# Patient Record
Sex: Male | Born: 1980 | Race: Black or African American | Hispanic: No | Marital: Single | Smoking: Current every day smoker
Health system: Southern US, Community
[De-identification: ages and names within clinical notes are randomized; demographics above are authoritative.]

## PROBLEM LIST (undated history)

## (undated) DIAGNOSIS — M1990 Unspecified osteoarthritis, unspecified site: Secondary | ICD-10-CM

---

## 2005-01-13 ENCOUNTER — Emergency Department (HOSPITAL_COMMUNITY): Admission: EM | Admit: 2005-01-13 | Discharge: 2005-01-13 | Payer: Self-pay | Admitting: Emergency Medicine

## 2010-01-01 ENCOUNTER — Emergency Department (HOSPITAL_COMMUNITY): Admission: EM | Admit: 2010-01-01 | Discharge: 2010-01-01 | Payer: Self-pay | Admitting: Emergency Medicine

## 2011-05-10 ENCOUNTER — Emergency Department (HOSPITAL_COMMUNITY)
Admission: EM | Admit: 2011-05-10 | Discharge: 2011-05-11 | Disposition: A | Discharge status: Home / Self Care | Payer: Self-pay | Attending: Emergency Medicine | Admitting: Emergency Medicine

## 2011-05-10 DIAGNOSIS — M25569 Pain in unspecified knee: Secondary | ICD-10-CM | POA: Insufficient documentation

## 2011-05-10 DIAGNOSIS — X500XXA Overexertion from strenuous movement or load, initial encounter: Secondary | ICD-10-CM | POA: Insufficient documentation

## 2011-05-10 DIAGNOSIS — IMO0002 Reserved for concepts with insufficient information to code with codable children: Secondary | ICD-10-CM | POA: Insufficient documentation

## 2011-05-11 ENCOUNTER — Emergency Department (HOSPITAL_COMMUNITY): Payer: Self-pay

## 2012-12-22 ENCOUNTER — Emergency Department (HOSPITAL_COMMUNITY): Payer: Self-pay

## 2012-12-22 ENCOUNTER — Inpatient Hospital Stay (HOSPITAL_COMMUNITY): Payer: Self-pay

## 2012-12-22 ENCOUNTER — Inpatient Hospital Stay (HOSPITAL_COMMUNITY)
Admission: EM | Admit: 2012-12-22 | Discharge: 2012-12-22 | DRG: 563 | Disposition: A | Discharge status: Home / Self Care | Payer: Self-pay | Attending: Emergency Medicine | Admitting: Emergency Medicine

## 2012-12-22 DIAGNOSIS — T07XXXA Unspecified multiple injuries, initial encounter: Secondary | ICD-10-CM

## 2012-12-22 DIAGNOSIS — S5292XA Unspecified fracture of left forearm, initial encounter for closed fracture: Secondary | ICD-10-CM

## 2012-12-22 DIAGNOSIS — IMO0002 Reserved for concepts with insufficient information to code with codable children: Secondary | ICD-10-CM | POA: Diagnosis present

## 2012-12-22 DIAGNOSIS — S52599A Other fractures of lower end of unspecified radius, initial encounter for closed fracture: Principal | ICD-10-CM | POA: Diagnosis present

## 2012-12-22 LAB — POCT I-STAT, CHEM 8
Creatinine, Ser: 1.3 mg/dL (ref 0.50–1.35)
HCT: 53 % — ABNORMAL HIGH (ref 39.0–52.0)
Hemoglobin: 18 g/dL — ABNORMAL HIGH (ref 13.0–17.0)
Potassium: 5.1 mEq/L (ref 3.5–5.1)
Sodium: 141 mEq/L (ref 135–145)
TCO2: 27 mmol/L (ref 0–100)

## 2012-12-22 MED ORDER — MORPHINE SULFATE 4 MG/ML IJ SOLN
4.0000 mg | Freq: Once | INTRAMUSCULAR | Status: AC
  Administered 2012-12-22: 4 mg via INTRAVENOUS
  Filled 2012-12-22: qty 1

## 2012-12-22 MED ORDER — TETANUS-DIPHTH-ACELL PERTUSSIS 5-2.5-18.5 LF-MCG/0.5 IM SUSP
0.5000 mL | Freq: Once | INTRAMUSCULAR | Status: AC
  Administered 2012-12-22: 0.5 mL via INTRAMUSCULAR
  Filled 2012-12-22: qty 0.5

## 2012-12-22 MED ORDER — ONDANSETRON HCL 4 MG/2ML IJ SOLN
4.0000 mg | Freq: Once | INTRAMUSCULAR | Status: AC
  Administered 2012-12-22: 4 mg via INTRAVENOUS
  Filled 2012-12-22: qty 2

## 2012-12-22 MED ORDER — HYDROCODONE-ACETAMINOPHEN 5-325 MG PO TABS: 1.0000 | ORAL_TABLET | ORAL | Status: DC | PRN

## 2012-12-22 MED ORDER — ONDANSETRON HCL 4 MG/2ML IJ SOLN
INTRAMUSCULAR | Status: AC
  Administered 2012-12-22: 4 mg via INTRAVENOUS
  Filled 2012-12-22: qty 2
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
## 2012-12-22 NOTE — ED Notes (Signed)
Patient laying on stretcher at this time. States he is still having some pain, rates pain 7/10. Informed patient that they will be coming to do another x ray of chest. Patient is in no acute distress, resp are even and unlabored. c-collar in place. Call light on bed. Will continue to monitor.

## 2012-12-22 NOTE — ED Notes (Signed)
Patient to radiology for CT and xrays.
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
## 2012-12-22 NOTE — ED Notes (Signed)
Ortho paged for splint 

## 2012-12-22 NOTE — ED Notes (Signed)
Patient returned from radiology

## 2012-12-22 NOTE — Progress Notes (Signed)
Orthopedic Tech Progress Note Patient Details:  Richard Turner 12-Jun-1981 [id number]  Ortho Devices Type of Ortho Device: Ace wrap;Arm sling;Sugartong splint Ortho Device/Splint Location: (L) UE Ortho Device/Splint Interventions: Application   [name] 12/22/2012, 11:10 PM

## 2012-12-22 NOTE — ED Notes (Signed)
Patients left hand cleaned and dressed. Ortho in room at this time to place splint on left arm.
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
## 2012-12-23 ENCOUNTER — Encounter (HOSPITAL_COMMUNITY): Payer: Self-pay | Admitting: Emergency Medicine
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
## 2013-01-02 ENCOUNTER — Emergency Department (HOSPITAL_COMMUNITY): Admission: EM | Admit: 2013-01-02 | Discharge: 2013-01-02 | Discharge status: Against Medical Advice | Payer: MEDICAID

## 2013-03-08 ENCOUNTER — Telehealth: Payer: Self-pay | Admitting: Family Medicine
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
## 2013-04-17 NOTE — Telephone Encounter (Signed)
Unable to leave a message.

## 2013-09-12 ENCOUNTER — Encounter (HOSPITAL_COMMUNITY): Payer: Self-pay | Admitting: Emergency Medicine

## 2013-09-12 ENCOUNTER — Emergency Department (HOSPITAL_COMMUNITY)
Admission: EM | Admit: 2013-09-12 | Discharge: 2013-09-12 | Disposition: A | Discharge status: Home / Self Care | Payer: Medicaid Other | Attending: Emergency Medicine | Admitting: Emergency Medicine

## 2013-09-12 DIAGNOSIS — F17200 Nicotine dependence, unspecified, uncomplicated: Secondary | ICD-10-CM | POA: Insufficient documentation

## 2013-09-12 DIAGNOSIS — K0889 Other specified disorders of teeth and supporting structures: Secondary | ICD-10-CM

## 2013-09-12 DIAGNOSIS — K089 Disorder of teeth and supporting structures, unspecified: Secondary | ICD-10-CM | POA: Insufficient documentation

## 2013-09-12 DIAGNOSIS — J029 Acute pharyngitis, unspecified: Secondary | ICD-10-CM | POA: Insufficient documentation

## 2013-09-12 MED ORDER — HYDROCODONE-ACETAMINOPHEN 5-325 MG PO TABS: 1.0000 | ORAL_TABLET | Freq: Four times a day (QID) | ORAL | Status: DC | PRN

## 2013-09-12 MED ORDER — PENICILLIN V POTASSIUM 500 MG PO TABS: 500.0000 mg | ORAL_TABLET | Freq: Four times a day (QID) | ORAL | Status: AC

## 2013-09-12 NOTE — ED Notes (Signed)
Pt. reports left lower molar pain / cavity  onset 2 days ago .

## 2013-09-12 NOTE — ED Provider Notes (Signed)
CSN: [id number]     Arrival date & time 09/12/13  3086 History  This chart was scribed for Raymon Mutton, PA-C, working with Flint Melter, MD, by Ardelia Mems ED Scribe. This patient was seen in room TR04C/TR04C and the patient's care was started at 11:38 PM.    Chief Complaint  Patient presents with  . Dental Pain    The history is provided by the patient. No language interpreter was used.    HPI Comments: Richard Turner is a 32 y.o. male who presents to the Emergency Department complaining of constant, moderate "throbbing" left lower dental pain onset 3 days ago. He states that he had a cavity in a left lower molar to the area that has recently cracked. He states that this pain has been waking him from sleep at times. He states that it has "been a while" since seen by dentist. He also reports associated sore throat, difficulty swallowing, facial swelling, intermittent fever and chills over the past few days. He further states that he has noticed some bleeding from the area, and he has noticed a bad taste in his mouth over the past few days. He states that he has taken Tylenol with minimal relief of pain. He states that he has no medication allergies.   History reviewed. No pertinent past medical history. History reviewed. No pertinent past surgical history. No family history on file. History  Substance Use Topics  . Smoking status: Current Every Day Smoker  . Smokeless tobacco: Not on file  . Alcohol Use: No    Review of Systems  Constitutional: Positive for fever and chills.  HENT: Positive for dental problem, facial swelling, sore throat and trouble swallowing.   All other systems reviewed and are negative.   Allergies  Review of patient's allergies indicates no known allergies.  Home Medications   Current Outpatient Rx  Name  Route  Sig  Dispense  Refill  . HYDROcodone-acetaminophen (NORCO/VICODIN) 5-325 MG per tablet   Oral   Take 1 tablet by mouth every 6 (six)  hours as needed.   11 tablet   0   . penicillin v potassium (VEETID) 500 MG tablet   Oral   Take 1 tablet (500 mg total) by mouth 4 (four) times daily.   40 tablet   0      Triage Vitals: BP 131/79  Pulse 86  Temp(Src) 98.1 F (36.7 C) (Oral)  Resp 16  SpO2 98%  Physical Exam  Nursing note and vitals reviewed. Constitutional: He is oriented to person, place, and time. He appears well-developed and well-nourished. No distress.  HENT:  Head: Normocephalic and atraumatic.  Mouth/Throat: Oropharynx is clear and moist. No oropharyngeal exudate.    Mild facial swelling localized to the left lower jawline. Mild discomfort upon palpation to left mandibular region. Poor dentition identified-second premolar of left mandibular jawline identified to be diagrammed in decaying process. Discomfort upon palpation. Negative abscess or active drainage identified. Negative bleeding identified. Negative trismus. Uvula midline, elevation. Negative uvula swelling. Negative sublingual lesions.  Eyes: Conjunctivae and EOM are normal. Pupils are equal, round, and reactive to light. Right eye exhibits no discharge. Left eye exhibits no discharge.  Neck: Normal range of motion. Neck supple.  Negative neck stiffness Negative nuchal rigidity Negative cervical lymphadenopathy  Cardiovascular: Normal rate, regular rhythm and normal heart sounds.   Pulses:      Radial pulses are 2+ on the right side, and 2+ on the left side.  Pulmonary/Chest: Effort normal  and breath sounds normal. No respiratory distress. He has no wheezes. He has no rales.  Musculoskeletal: Normal range of motion.  Lymphadenopathy:    He has no cervical adenopathy.  Neurological: He is alert and oriented to person, place, and time. No cranial nerve deficit. He exhibits normal muscle tone. Coordination normal.  Cranial nerves III-XII grossly intact  Skin: Skin is warm and dry. No rash noted. He is not diaphoretic. No erythema.   Psychiatric: He has a normal mood and affect. His behavior is normal. Thought content normal.    ED Course  Procedures (including critical care time)  DIAGNOSTIC STUDIES: Oxygen Saturation is 98% on RA, normal by my interpretation.    COORDINATION OF CARE: 11:44 PM- Discussed plan to discharge pt with Veetid and Norco. Will refer to Dentistry. Pt advised of plan for treatment and pt agrees.  Labs Review Labs Reviewed - No data to display Imaging Review No results found.  EKG Interpretation   None       MDM   1. Pain, dental    Filed Vitals:   09/12/13 1940  BP: 131/79  Pulse: 86  Temp: 98.1 F (36.7 C)  TempSrc: Oral  Resp: 16  SpO2: 98%   I personally performed the services described in this documentation, which was scribed in my presence. The recorded information has been reviewed and is accurate.  Patient presenting to emergency apartment that the pain has been ongoing for the past couple of days, worsening identified. Localized to the left side of the face with radiation to the left side of the face. Describes intermittent numbness and tingling. Alert and oriented. GCS 15. Heart rate and rhythm normal. Lungs good auscultation bilaterally. Mild swelling localized left mandibular region. Discomfort upon palpation to left mandibular region. Diagrammed second premolar of the left mandible jawline identified with pain upon palpation to his tooth currently in decaying process. Negative trismus. Negative uvula swelling. Uvula midline, symmetrical elevation. Negative for blood in the lesions. Doubt peritonsillar abscess. Doubt Ludwig's angina. Dental pain secondary to poor dental hygiene, as well as port dental followup. Patient stable, afebrile. Possible beginnings of periapical abscess identified. Patient discharged. Discharge patient with small dose of pain medications-discussed course, precautions, disposal technique. Discharge patient with antibiotics. Referred patient to  dentist and oral surgeon. Dental referral sheet given in emergency Department discharge paperwork. Discussed with patient to closely monitor symptoms and if symptoms are to worsen or change to report back to the ED - strict return instructions given.  Patient agreed to plan of care, understood, all questions answered.    Raymon Mutton, PA-C 09/15/13 1512

## 2013-09-12 NOTE — ED Notes (Signed)
The pt keeps coming from the room c/o pain and asking for the same

## 2013-09-12 NOTE — ED Notes (Signed)
Pt asking for pain med 

## 2013-09-16 NOTE — ED Provider Notes (Signed)
Medical screening examination/treatment/procedure(s) were performed by non-physician practitioner and as supervising physician I was immediately available for consultation/collaboration.  Flint MelterElliott L , MD 09/16/13 [phone]

## 2013-10-01 ENCOUNTER — Encounter (HOSPITAL_COMMUNITY): Payer: Self-pay | Admitting: Emergency Medicine

## 2013-10-01 ENCOUNTER — Emergency Department (HOSPITAL_COMMUNITY)
Admission: EM | Admit: 2013-10-01 | Discharge: 2013-10-01 | Disposition: A | Discharge status: Home / Self Care | Payer: Medicaid Other | Attending: Emergency Medicine | Admitting: Emergency Medicine

## 2013-10-01 DIAGNOSIS — K089 Disorder of teeth and supporting structures, unspecified: Secondary | ICD-10-CM | POA: Insufficient documentation

## 2013-10-01 DIAGNOSIS — Z792 Long term (current) use of antibiotics: Secondary | ICD-10-CM | POA: Insufficient documentation

## 2013-10-01 DIAGNOSIS — R599 Enlarged lymph nodes, unspecified: Secondary | ICD-10-CM | POA: Insufficient documentation

## 2013-10-01 DIAGNOSIS — K0889 Other specified disorders of teeth and supporting structures: Secondary | ICD-10-CM

## 2013-10-01 DIAGNOSIS — F17200 Nicotine dependence, unspecified, uncomplicated: Secondary | ICD-10-CM | POA: Insufficient documentation

## 2013-10-01 MED ORDER — PENICILLIN V POTASSIUM 250 MG PO TABS: 500.0000 mg | ORAL_TABLET | Freq: Four times a day (QID) | ORAL | Status: AC

## 2013-10-01 MED ORDER — HYDROCODONE-ACETAMINOPHEN 5-325 MG PO TABS
2.0000 | ORAL_TABLET | Freq: Once | ORAL | Status: AC
  Administered 2013-10-01: 2 via ORAL
  Filled 2013-10-01: qty 2

## 2013-10-01 MED ORDER — PENICILLIN V POTASSIUM 250 MG PO TABS
500.0000 mg | ORAL_TABLET | Freq: Once | ORAL | Status: AC
  Administered 2013-10-01: 500 mg via ORAL
  Filled 2013-10-01: qty 2

## 2013-10-01 MED ORDER — HYDROCODONE-ACETAMINOPHEN 5-325 MG PO TABS: 1.0000 | ORAL_TABLET | ORAL | Status: DC | PRN

## 2013-10-01 NOTE — Discharge Instructions (Signed)
The antibiotics prescribed you for the full course. This antibiotic [facility]. Recommend 600 mg ibuprofen every 6 hours for pain control. You may take Norco as prescribed for breakthrough pain. Follow up with a dentist on Monday. Return if symptoms worsen.  Dental Pain A tooth ache may be caused by cavities (tooth decay). Cavities expose the nerve of the tooth to air and hot or cold temperatures. It may come from an infection or abscess (also called a boil or furuncle) around your tooth. It is also often caused by dental caries (tooth decay). This causes the pain you are having. DIAGNOSIS  Your caregiver can diagnose this problem by exam. TREATMENT   If caused by an infection, it may be treated with medications which kill germs (antibiotics) and pain medications as prescribed by your caregiver. Take medications as directed.  Only take over-the-counter or prescription medicines for pain, discomfort, or fever as directed by your caregiver.  Whether the tooth ache today is caused by infection or dental disease, you should see your dentist as soon as possible for further care. SEEK MEDICAL CARE IF: The exam and treatment you received today has been provided on an emergency basis only. This is not a substitute for complete medical or dental care. If your problem worsens or new problems (symptoms) appear, and you are unable to meet with your dentist, call or return to this location. SEEK IMMEDIATE MEDICAL CARE IF:   You have a fever.  You develop redness and swelling of your face, jaw, or neck.  You are unable to open your mouth.  You have severe pain uncontrolled by pain medicine. MAKE SURE YOU:   Understand these instructions.  Will watch your condition.  Will get help right away if you are not doing well or get worse. Document Released: 08/31/2005 Document Revised: 11/23/2011 Document Reviewed: 04/18/2008 [facility] Patient Information 2014 [place] [employer],  [place].  Emergency Department Resource Guide 1) Find a Doctor and Pay Out of Pocket Although you won't have to find out who is covered by your insurance plan, it is a good idea to ask around and get recommendations. You will then need to call the office and see if the doctor you have chosen will accept you as a new patient and what types of options they offer for patients who are self-pay. Some doctors offer discounts or will set up payment plans for their patients who do not have insurance, but you will need to ask so you aren't surprised when you get to your appointment.  2) Contact Your Local Health Department Not all health departments have doctors that can see patients for sick visits, but many do, so it is worth a call to see if yours does. If you don't know where your local health department is, you can check in your phone book. The CDC also has a tool to help you locate your state's health department, and many state websites also have listings of all of their local health departments.  3) Find a Walk-in Clinic If your illness is not likely to be very severe or complicated, you may want to try a walk in clinic. These are popping up all over the country in pharmacies, drugstores, and shopping centers. They're usually staffed by nurse practitioners or physician assistants that have been trained to treat common illnesses and complaints. They're usually fairly quick and inexpensive. However, if you have serious medical issues or chronic medical problems, these are probably not your best option.  No  Primary Care Doctor: - Call Health Connect at  [phone] - they can help you locate a primary care doctor that  accepts your insurance, provides certain services, etc. - Physician Referral Service- [phone]  Chronic Pain Problems: Organization         Address  Phone   Notes  [facility]  [phone] Patients need to be referred by their primary care doctor.   Medication  Assistance: Organization         Address  Phone   Notes  [facility] Medication [facility] [place] [place], [place] [address] [phone] --Must be a resident of [facility] -- Must have NO insurance coverage whatsoever (no Medicaid/ Medicare, etc.) -- The pt. MUST have a primary care doctor that directs their care regularly and follows them in the community   [facility]  [phone]   [employer]  [phone]    Agencies that provide inexpensive medical care: Organization         Address  Phone   Notes  [place]  [phone]   [facility]    [phone]   [facility] [place], [place] [address] [phone]   [place] [address], [place] [phone]   Planned Parenthood    [phone]   [facility]    [phone]   [place] and [place] [address], [place] Phone:  [phone], Fax:  [phone] Hours of Operation:  9 am - 6 pm, M-F.  Also accepts Medicaid/Medicare and self-pay.  [facility] for [place] [address], [address], [place] Phone: [phone], Fax: [phone]. Hours of Operation:  8:30 am - 5:30 pm, M-F.  Also accepts Medicaid and self-pay.  [facility] [place] [address], [place] Phone: [phone]   [place], [place], [place] [phone], Ext. [phone] Mondays & Thursdays: 7-9 AM.  First 15 patients are seen on a first come, first serve basis.    Superior Providers:  Organization         Address  Phone   Notes  [facility] [address], [place] [phone] Also accepts self-pay patients.  [facility] [address], [place]  [phone]   [place], [address], [place]  [phone]   [facility] [address] [place] [phone]   Lucianne Lei [address], [place]   [phone] Only accepts [place] Access [place] patients after they have their name applied to their card.   Self-Pay (no insurance) in [facility]:  Organization         Address  Phone   Notes  Sickle Cell Patients, [facility] [place] [phone]   [facility] [place] [phone]   [facility] [place]  [place], [place], [place] [phone]   [facility]/Dr. Osei-Bonsu  [address] [place] or [address], [place] [phone] Phone number for both [place] and [place] locations is the same.  Urgent Medical and [facility] [address], [place] [phone]  [facility] [address] [place] or [address] [phone]   [facility] [place], [place] [phone], phone; [phone], fax Sees patients 1st and 3rd Saturday of every month.  Must not qualify for public or private insurance (i.e. Medicaid, Medicare, [place] Health Choice, Veterans' Benefits)  Household income should be no more than 200% of the poverty level The clinic cannot treat you if you are pregnant or think you are pregnant  Sexually transmitted diseases are not treated at the clinic.    Dental Care: Organization         Address  Phone  Notes  [facility] of [facility] [place] [phone] Accepts children up to age 67 who are enrolled in [place] or [place]; pregnant women with a Medicaid card; and children who have applied for Medicaid or [place] Health Choice, but were declined, whose parents can pay a reduced fee at time of service.  [facility]  Department of [facility]  [address], [place] [phone] Accepts children up to age 57 who are enrolled in [place] or [place]; pregnant women with a Medicaid card; and children who have applied for Medicaid or [place] Health Choice, but were declined, whose parents can pay a reduced fee at time of service.  [place] Adult [facility] Access PRO[facility]  [place] [phone] Patients are seen by appointment only. Walk-ins are not accepted. [place] will see patients 41 years of age and older. Monday - Tuesday (8am-5pm) Most Wednesdays (8:30-5pm) $30 per visit, cash only  [facility] Adult Dental Access PROGRAM  [address], [facility] [phone] Patients are seen by appointment only. Walk-ins are not accepted. [place] will see patients 73 years of age and older. One Wednesday Evening (Monthly: Volunteer Based).  $30 per visit, cash only  [place]  [phone] for adults; Children under age 55, call Graduate Pediatric Dentistry at [phone]. Children aged 1-14, please call [phone] to request a pediatric application.  Dental services are provided in all areas of dental care including fillings, crowns and bridges, complete and partial dentures, implants, gum treatment, root canals, and extractions. Preventive care is also provided. Treatment is provided to both adults and children. Patients are selected via a lottery and there is often a waiting list.   [facility] [address], [place]  [phone] [email or web address]   [facility] [address] [place], [place] [phone], Ext. [phone] Second and Fourth Thursday of each month, opens at 6:30 AM; Clinic ends at 9 AM.  Patients are seen on a first-come first-served basis, and a limited number are seen during each clinic.   [facility]  [address] Hillard Danker [place], [place] [phone]    Eligibility Requirements You must have lived in [place], [place], or [place] counties for at least the last three months.   You cannot be eligible for state or federal sponsored [employer], including [employer], [place], or [employer].   You generally cannot be eligible for healthcare insurance through your employer.    How to apply: Eligibility screenings are held every Tuesday and Wednesday afternoon from 1:00 pm until 4:00 pm. You do not need an appointment for the interview!  [facility] [address], [place], [place]  [place]  [place] Department  [place]  [phone]    Behavioral Health Resources in the Community: Intensive Outpatient Programs Organization         Address  Phone  Notes  [place]. [address] [place], [place] [phone]   [facility] Outpatient [address], [place], [place]   ADS: Alcohol & Drug S[facility]cs [address] [place], [place]   [place] [address],  [place], [place] or [phone]   Substance Abuse Resources Organization         Address  Phone  Notes  Alcohol and Drug Services  [phone]   [place]  [phone]   [facility] [place]   Chinita Pester  [phone]   Residential & Outpatient Substance Abuse Program  [phone]   Psychological Services Organization         Address  Phone  Notes  [facility] [place]  [place]  [phone]   [place] [address], [place] or [phone]    Mobile Crisis Teams Organization         Address  Phone  Notes  [facility], Mobile Crisis Care Unit  [phone]   [facility]  [address]  [place], [place]   [place] [address] [place] [phone]    Self-Help/Support Groups Organization         Address  Phone             Notes  [place]. of [place] - variety of support groups  [place] Call for more information  Narcotics Anonymous (NA), [facility] [address], [employer] [place]  2 meetings at this location   Special educational needs teacher         Address  Phone  Notes  [facility] Residential Treatment [place],    [place]  [phone]   [facility]  [address] [place] 462863, [place], [place]   [place], [place] [phone] Admissions: 8am-3pm M-F  Incentives Substance [place] [address],    [place], [place] [phone]   [facility] [address] [place], [place], [place]   [facility] [address]  [place], [place]   [facility] Programs - Intensive Outpatient [address], [place], [place]   [facility] ([place].) [place].,  [place], [place] [phone] or [phone]   [facility]idential Treatment Services (RTS) [address] [place], [place] Accepts Medicaid  [facility] [place] [address]  [place] [phone] Substance Abuse/Addiction Treatment   [facility] Organization         Address  Phone  Notes  [facility]  [phone]   Domenic Schwab, PhD [address] [place], [place]   [phone] or [phone]   [place], [place] [phone]   [place] [address], [place], [place] [phone] Insurance/Medicaid/sponsorship through [employer] and Families [address], EYE [address] [place], [place] [phone] [place], [place] [phone]    Dr. Adele Schilder  [phone]   [facility] Dept. 1) [address] [place] 2) [place] 3)  [place] [address], [place] [phone]  [phone]   [place] [phone] or [phone] (After Hours)

## 2013-10-01 NOTE — ED Notes (Signed)
Pt reports left lower molar pain for several months and now left upper gum swelling and left facial swelling, pain and left ear pain. States that he has not followed up with a dentist since he was seen on 09/12/13 due to financial issues. States he has been taking OTC pain meds with no relief.

## 2013-10-01 NOTE — ED Provider Notes (Signed)
CSN: [id number]     Arrival date & time 10/01/13  0034 History   First MD Initiated Contact with Patient 10/01/13 0051     Chief Complaint  Patient presents with  . Dental Pain   (Consider location/radiation/quality/duration/timing/severity/associated sxs/prior Treatment) Patient is a 33 y.o. male presenting with tooth pain. The history is provided by the patient. No language interpreter was used.  Dental Pain Location:  Lower Lower teeth location:  18/LL 2nd molar Quality:  Throbbing and aching Severity:  Mild Onset quality:  Gradual Duration:  2 months Timing:  Intermittent Progression:  Waxing and waning Chronicity:  Recurrent Context: dental fracture and poor dentition   Context: not recent dental surgery and not trauma   Relieved by:  Nothing Worsened by:  Touching and pressure Ineffective treatments:  Acetaminophen and NSAIDs Associated symptoms: no difficulty swallowing, no drooling, no facial pain, no fever, no neck pain, no oral bleeding, no oral lesions and no trismus   Risk factors: lack of dental care and smoking     History reviewed. No pertinent past medical history. History reviewed. No pertinent past surgical history. History reviewed. No pertinent family history. History  Substance Use Topics  . Smoking status: Current Every Day Smoker  . Smokeless tobacco: Never Used  . Alcohol Use: No    Review of Systems  Constitutional: Negative for fever.  HENT: Positive for dental problem. Negative for drooling and mouth sores.   Musculoskeletal: Negative for neck pain.  All other systems reviewed and are negative.    Allergies  Review of patient's allergies indicates no known allergies.  Home Medications   Current Outpatient Rx  Name  Route  Sig  Dispense  Refill  . acetaminophen (TYLENOL) 500 MG tablet   Oral   Take 1,000 mg by mouth every 6 (six) hours as needed for mild pain.         Marland Kitchen. ibuprofen (ADVIL,MOTRIN) 200 MG tablet   Oral   Take 400 mg  by mouth every 6 (six) hours as needed for moderate pain.         Mar[place] Kitchen. HYDROcodone-acetaminophen (NORCO/VICODIN) 5-325 MG per tablet   Oral   Take 1 tablet by mouth every 4 (four) hours as needed.   7 tablet   0   . penicillin v potassium (VEETID) 250 MG tablet   Oral   Take 2 tablets (500 mg total) by mouth 4 (four) times daily.   80 tablet   0    BP 103/70  Pulse 87  Temp(Src) 98 F (36.7 C) (Oral)  Resp 18  Ht 6\' 3"  (1.905 m)  Wt 244 lb 4.8 oz (110.814 kg)  BMI 30.54 kg/m2  SpO2 94%  Physical Exam  Nursing note and vitals reviewed. Constitutional: He is oriented to person, place, and time. He appears well-developed and well-nourished. No distress.  HENT:  Head: Normocephalic and atraumatic.  Right Ear: Tympanic membrane, external ear and ear canal normal. No mastoid tenderness.  Left Ear: Tympanic membrane, external ear and ear canal normal. No mastoid tenderness.  Nose: Nose normal.  Mouth/Throat: Uvula is midline, oropharynx is clear and moist and mucous membranes are normal. No oral lesions. No trismus in the jaw. Abnormal dentition. Dental caries present. No uvula swelling or lacerations.    Uvula midline and patient tolerating secretions without difficulty. Airway patent.  Eyes: Conjunctivae and EOM are normal. Pupils are equal, round, and reactive to light. No scleral icterus.  Neck: Normal range of motion. Neck supple.  Cardiovascular:  Normal rate, regular rhythm and normal heart sounds.   Pulmonary/Chest: Effort normal and breath sounds normal. No stridor. No respiratory distress. He has no wheezes. He has no rales.  Musculoskeletal: Normal range of motion.  Lymphadenopathy:    He has cervical adenopathy (Mild left-sided).  Neurological: He is alert and oriented to person, place, and time.  Skin: Skin is warm and dry. No rash noted. He is not diaphoretic. No erythema. No pallor.  Psychiatric: He has a normal mood and affect. His behavior is normal.    ED  Course  Procedures (including critical care time) Labs Review Labs Reviewed - No data to display Imaging Review No results found.  EKG Interpretation   None       MDM   1. Dentalgia    Uncomplicated dentalgia. Patient is well and nontoxic appearing, hemodynamically stable, and afebrile. No nuchal rigidity or meningismus on physical exam. Uvula midline without evidence of peritonsillar abscess. Patient tolerating secretions no difficulty. No trismus or stridor appreciated. No red flags or signs concerning for Ludwig's angina. Patient stable for discharge with instruction to followup with dentist within 48 hours. Have provided referral to dentist on call as well as resource guide with low-cost dental services listed. Patient will be started on penicillin to cover for infection. Ibuprofen advised for pain control. Return precautions discussed and patient agreeable to plan with no unaddressed concerns.    Antony Madura, PA-C 10/01/13 [phone]

## 2013-10-01 NOTE — ED Notes (Signed)
Patient presents with c/o dental pain  Bottom left in the back a tooth has a hole in it

## 2013-10-02 NOTE — ED Provider Notes (Signed)
Medical screening examination/treatment/procedure(s) were performed by non-physician practitioner and as supervising physician I was immediately available for consultation/collaboration.  EKG Interpretation   None         , MD 10/02/13 0339 

## 2014-04-20 ENCOUNTER — Encounter (HOSPITAL_COMMUNITY): Payer: Self-pay | Admitting: Emergency Medicine

## 2014-04-20 ENCOUNTER — Emergency Department (HOSPITAL_COMMUNITY)
Admission: EM | Admit: 2014-04-20 | Discharge: 2014-04-20 | Disposition: A | Discharge status: Home / Self Care | Payer: Medicaid Other | Attending: Emergency Medicine | Admitting: Emergency Medicine

## 2014-04-20 ENCOUNTER — Emergency Department (HOSPITAL_COMMUNITY): Payer: Medicaid Other

## 2014-04-20 DIAGNOSIS — N481 Balanitis: Secondary | ICD-10-CM

## 2014-04-20 DIAGNOSIS — F17200 Nicotine dependence, unspecified, uncomplicated: Secondary | ICD-10-CM | POA: Diagnosis not present

## 2014-04-20 DIAGNOSIS — N476 Balanoposthitis: Secondary | ICD-10-CM | POA: Insufficient documentation

## 2014-04-20 DIAGNOSIS — R079 Chest pain, unspecified: Secondary | ICD-10-CM | POA: Insufficient documentation

## 2014-04-20 DIAGNOSIS — Z720 Tobacco use: Secondary | ICD-10-CM

## 2014-04-20 LAB — CBC
HCT: 47.1 % (ref 39.0–52.0)
Hemoglobin: 15.6 g/dL (ref 13.0–17.0)
MCH: 27.1 pg (ref 26.0–34.0)
MCHC: 33.1 g/dL (ref 30.0–36.0)
MCV: 81.8 fL (ref 78.0–100.0)
Platelets: 131 10*3/uL — ABNORMAL LOW (ref 150–400)
RBC: 5.76 MIL/uL (ref 4.22–5.81)
RDW: 14.2 % (ref 11.5–15.5)
WBC: 6.5 10*3/uL (ref 4.0–10.5)

## 2014-04-20 LAB — I-STAT TROPONIN, ED
TROPONIN I, POC: 0 ng/mL (ref 0.00–0.08)
Troponin i, poc: 0 ng/mL (ref 0.00–0.08)

## 2014-04-20 LAB — BASIC METABOLIC PANEL
Anion gap: 11 (ref 5–15)
BUN: 14 mg/dL (ref 6–23)
CHLORIDE: 105 meq/L (ref 96–112)
CO2: 24 mEq/L (ref 19–32)
Calcium: 9.2 mg/dL (ref 8.4–10.5)
Creatinine, Ser: 1.13 mg/dL (ref 0.50–1.35)
GFR calc non Af Amer: 83 mL/min — ABNORMAL LOW (ref >90)
Glucose, Bld: 101 mg/dL — ABNORMAL HIGH (ref 70–99)
POTASSIUM: 4.4 meq/L (ref 3.7–5.3)
Sodium: 140 mEq/L (ref 137–147)

## 2014-04-20 MED ORDER — ASPIRIN 325 MG PO TABS
325.0000 mg | ORAL_TABLET | ORAL | Status: AC
  Administered 2014-04-20: 325 mg via ORAL
  Filled 2014-04-20: qty 1

## 2014-04-20 MED ORDER — NYSTATIN 100000 UNIT/GM EX CREA
TOPICAL_CREAM | CUTANEOUS | Status: DC
Start: 2014-04-20 — End: 2014-06-16

## 2014-04-20 NOTE — Discharge Instructions (Signed)
Your caregiver has diagnosed you as having chest pain that is not specific for one problem, but does not require admission.  You are at low risk for an acute heart condition or other serious illness. Chest pain comes from many different causes.  SEEK IMMEDIATE MEDICAL ATTENTION IF: You have severe chest pain, especially if the pain is crushing or pressure-like and spreads to the arms, back, neck, or jaw, or if you have sweating, nausea (feeling sick to your stomach), or shortness of breath. THIS IS AN EMERGENCY. Don't wait to see if the pain will go away. Get medical help at once. Call 911 or 0 (operator). DO NOT drive yourself to the hospital.  Your chest pain gets worse and does not go away with rest.  You have an attack of chest pain lasting longer than usual, despite rest and treatment with the medications your caregiver has prescribed.  You wake from sleep with chest pain or shortness of breath.  You feel dizzy or faint.  You have chest pain not typical of your usual pain for which you originally saw your caregiver.   Balanitis Balanitis is inflammation of the head of the penis (glans).  CAUSES  Balanitis has multiple causes, both infectious and noninfectious. Often balanitis is the result of poor personal hygiene, especially in uncircumcised males. Without adequate washing, viruses, bacteria, and yeast collect between the foreskin and the glans. This can cause an infection. Lack of air and irritation from a normal secretion called smegma contribute to the cause in uncircumcised males. Other causes include:  Chemical irritation from the use of certain soaps and shower gels (especially soaps with perfumes), condoms, personal lubricants, petroleum jelly, spermicides, and fabric conditioners.  Skin conditions, such as eczema, dermatitis, and psoriasis.  Allergies to drugs, such as tetracycline and sulfa.  Certain medical conditions, including liver cirrhosis, congestive heart failure, and  kidney disease.  Morbid obesity. RISK FACTORS  Diabetes mellitus.  A tight foreskin that is difficult to pull back past the glans (phimosis).  Sex without the use of a condom. SIGNS AND SYMPTOMS  Symptoms may include:  Discharge coming from under the foreskin.  Tenderness.  Itching and inability to get an erection (because of the pain).  Redness and a rash.  Sores on the glans and on the foreskin. DIAGNOSIS Diagnosis of balanitis is confirmed through a physical exam. TREATMENT The treatment is based on the cause of the balanitis. Treatment may include:  Frequent cleansing.  Keeping the glans and foreskin dry.  Use of medicines such as creams, pain medicines, antibiotics, or medicines to treat fungal infections.  Sitz baths. If the irritation has caused a scar on the foreskin that prevents easy retraction, a circumcision may be recommended.  HOME CARE INSTRUCTIONS  Sex should be avoided until the condition has cleared. MAKE SURE YOU:  Understand these instructions.  Will watch your condition.  Will get help right away if you are not doing well or get worse. Document Released: 01/17/2009 Document Revised: 09/05/2013 Document Reviewed: 02/20/2013 [facility] Patient Information 2015 [place], [place]. This information is not intended to replace advice given to you by your health care provider. Make sure you discuss any questions you have with your health care provider.  Chest Pain (Nonspecific) It is often hard to give a specific diagnosis for the cause of chest pain. There is always a chance that your pain could be related to something serious, such as a heart attack or a blood clot in the lungs. You need to  follow up with your health care provider for further evaluation. CAUSES   Heartburn.  Pneumonia or bronchitis.  Anxiety or stress.  Inflammation around your heart (pericarditis) or lung (pleuritis or pleurisy).  A blood clot in the lung.  A collapsed lung  (pneumothorax). It can develop suddenly on its own (spontaneous pneumothorax) or from trauma to the chest.  Shingles infection (herpes zoster virus). The chest wall is composed of bones, muscles, and cartilage. Any of these can be the source of the pain.  The bones can be bruised by injury.  The muscles or cartilage can be strained by coughing or overwork.  The cartilage can be affected by inflammation and become sore (costochondritis). DIAGNOSIS  Lab tests or other studies may be needed to find the cause of your pain. Your health care provider may have you take a test called an ambulatory electrocardiogram (ECG). An ECG records your heartbeat patterns over a 24-hour period. You may also have other tests, such as:  Transthoracic echocardiogram (TTE). During echocardiography, sound waves are used to evaluate how blood flows through your heart.  Transesophageal echocardiogram (TEE).  Cardiac monitoring. This allows your health care provider to monitor your heart rate and rhythm in real time.  Holter monitor. This is a portable device that records your heartbeat and can help diagnose heart arrhythmias. It allows your health care provider to track your heart activity for several days, if needed.  Stress tests by exercise or by giving medicine that makes the heart beat faster. TREATMENT   Treatment depends on what may be causing your chest pain. Treatment may include:  Acid blockers for heartburn.  Anti-inflammatory medicine.  Pain medicine for inflammatory conditions.  Antibiotics if an infection is present.  You may be advised to change lifestyle habits. This includes stopping smoking and avoiding alcohol, caffeine, and chocolate.  You may be advised to keep your head raised (elevated) when sleeping. This reduces the chance of acid going backward from your stomach into your esophagus. Most of the time, nonspecific chest pain will improve within 2-3 days with rest and mild pain  medicine.  HOME CARE INSTRUCTIONS   If antibiotics were prescribed, take them as directed. Finish them even if you start to feel better.  For the next few days, avoid physical activities that bring on chest pain. Continue physical activities as directed.  Do not use any tobacco products, including cigarettes, chewing tobacco, or electronic cigarettes.  Avoid drinking alcohol.  Only take medicine as directed by your health care provider.  Follow your health care provider's suggestions for further testing if your chest pain does not go away.  Keep any follow-up appointments you made. If you do not go to an appointment, you could develop lasting (chronic) problems with pain. If there is any problem keeping an appointment, call to reschedule. SEEK MEDICAL CARE IF:   Your chest pain does not go away, even after treatment.  You have a rash with blisters on your chest.  You have a fever. SEEK IMMEDIATE MEDICAL CARE IF:   You have increased chest pain or pain that spreads to your arm, neck, jaw, back, or abdomen.  You have shortness of breath.  You have an increasing cough, or you cough up blood.  You have severe back or abdominal pain.  You feel nauseous or vomit.  You have severe weakness.  You faint.  You have chills. This is an emergency. Do not wait to see if the pain will go away. Get medical help  at once. Call your local emergency services (911 in [place] Do not drive yourself to the hospital. MAKE SURE YOU:   Understand these instructions.  Will watch your condition.  Will get help right away if you are not doing well or get worse. Document Released: 06/10/2005 Document Revised: 09/05/2013 Document Reviewed: 04/05/2008 [facility] Patient Information 2015 [place], [place]. This information is not intended to replace advice given to you by your health care provider. Make sure you discuss any questions you have with your health care provider.

## 2014-04-20 NOTE — ED Notes (Signed)
Pt complains of central pressure like chest pain for two hours, he also complains of penile irritation, no discharge or bumps, he states he's not circumsized and has used nystatin cream before

## 2014-04-20 NOTE — ED Provider Notes (Signed)
CSN: [id number]     Arrival date & time 04/20/14  0018 History   First MD Initiated Contact with Patient 04/20/14 0258     Chief Complaint  Patient presents with  . Chest Pain     (Consider location/radiation/quality/duration/timing/severity/associated sxs/prior Treatment) HPI  Richard Turner This 33 year old male who presents emergency Department with chief complaint of chest pain and penile irritation. Patient states that this morning he was watching television when he had sudden onset of central chest pain and pressure which he rates at a 5/10. No radiation, no associated dyspnea, nausea, vomiting, diaphoresis, pain radiates into the left shoulder and left jaw. Patient is a current daily smoker. He denies any cocaine abuse, hypertension, high cholesterol or family history of MI. Patient states he has had episodes like this previously. Patient also complains of irritation of the head of the penis. He has had this before. He states that occasionally he gets a yeast infection and he is circumcised. He denies dysuria, hematuria, frequency, or urgency, or penile discharge.   History reviewed. No pertinent past medical history. History reviewed. No pertinent past surgical history. History reviewed. No pertinent family history. History  Substance Use Topics  . Smoking status: Current Every Day Smoker  . Smokeless tobacco: Never Used  . Alcohol Use: No    Review of Systems  Ten systems reviewed and are negative for acute change, except as noted in the HPI.    Allergies  Review of patient's allergies indicates no known allergies.  Home Medications   Prior to Admission medications   Not on File   BP 117/72  Pulse 59  Temp(Src) 98.3 F (36.8 C) (Oral)  Resp 12  SpO2 98% Physical Exam Physical Exam  Nursing note and vitals reviewed. Constitutional: He appears well-developed and well-nourished. No distress.  HENT:  Head: Normocephalic and atraumatic.  Eyes: Conjunctivae  normal are normal. No scleral icterus.  Neck: Normal range of motion. Neck supple.  Cardiovascular: Normal rate, regular rhythm and normal heart sounds.   Pulmonary/Chest: Effort normal and breath sounds normal. No respiratory distress.  Abdominal: Soft. There is no tenderness.  Musculoskeletal: He exhibits no edema.  Neurological: He is alert.  Skin: Skin is warm and dry. He is not diaphoretic.  Psychiatric: His behavior is normal.  GU: irritation of the glans, uncircumcised. No discharge.   ED Course  Procedures (including critical care time) Labs Review Labs Reviewed  CBC - Abnormal; Notable for the following:    Platelets 131 (*)    All other components within normal limits  BASIC METABOLIC PANEL - Abnormal; Notable for the following:    Glucose, Bld 101 (*)    GFR calc non Af Amer 83 (*)    All other components within normal limits  I-STAT TROPOININ, ED    Imaging Review No results found.   EKG Interpretation   Date/Time:  Friday April 20 2014 00:29:45 EDT Ventricular Rate:  79 PR Interval:  152 QRS Duration: 91 QT Interval:  353 QTC Calculation: 405 R Axis:   64 Text Interpretation:  Sinus rhythm ST elev, probable normal early repol  pattern No old tracing to compare Confirmed by KNAPP  MD-I, IVA ([id number]) on  04/20/2014 12:45:00 AM      MDM   Final diagnoses:  Chest pain, unspecified chest pain type  Balanitis  Tobacco abuse    3:12 AM BP 117/72  Pulse 59  Temp(Src) 98.3 F (36.8 C) (Oral)  Resp 12  SpO2 98% Patient with  negative troponin, mild hyperglycemia. Cxr pending  ekg with early repol Will repeat troponin. No cp at this time. Balanitis of glans penis.    Patient cxr and repeat trop negative.  d/c with nystatin cream.  I personally reviewed the imaging tests through PACS system. I have reviewed and interpreted Lab values. I reviewed available ER/hospitalization records through the EMR     Arthor Captainbigail , [place] JerseyPA-C 04/22/14 0901

## 2014-04-25 NOTE — ED Provider Notes (Signed)
Medical screening examination/treatment/procedure(s) were performed by non-physician practitioner and as supervising physician I was immediately available for consultation/collaboration.   EKG Interpretation   Date/Time:  Friday April 20 2014 00:29:45 EDT Ventricular Rate:  79 PR Interval:  152 QRS Duration: 91 QT Interval:  353 QTC Calculation: 405 R Axis:   64 Text Interpretation:  Sinus rhythm ST elev, probable normal early repol  pattern No old tracing to compare Confirmed by   MD-I,  ([id number]) on  04/20/2014 12:45:00 AM      Devoria Albe , MD, Franz DellFACEP    L , MD 04/25/14 1500

## 2014-06-16 ENCOUNTER — Emergency Department (HOSPITAL_COMMUNITY)
Admission: EM | Admit: 2014-06-16 | Discharge: 2014-06-16 | Disposition: A | Discharge status: Home / Self Care | Payer: Medicaid Other | Attending: Emergency Medicine | Admitting: Emergency Medicine

## 2014-06-16 ENCOUNTER — Encounter (HOSPITAL_COMMUNITY): Payer: Self-pay | Admitting: Emergency Medicine

## 2014-06-16 DIAGNOSIS — K088 Other specified disorders of teeth and supporting structures: Secondary | ICD-10-CM | POA: Insufficient documentation

## 2014-06-16 DIAGNOSIS — Z720 Tobacco use: Secondary | ICD-10-CM | POA: Diagnosis not present

## 2014-06-16 DIAGNOSIS — K0889 Other specified disorders of teeth and supporting structures: Secondary | ICD-10-CM

## 2014-06-16 MED ORDER — OXYCODONE-ACETAMINOPHEN 5-325 MG PO TABS
1.0000 | ORAL_TABLET | Freq: Once | ORAL | Status: AC
  Administered 2014-06-16: 1 via ORAL
  Filled 2014-06-16: qty 1

## 2014-06-16 MED ORDER — PENICILLIN V POTASSIUM 500 MG PO TABS: 500.0000 mg | ORAL_TABLET | Freq: Four times a day (QID) | ORAL | Status: AC

## 2014-06-16 NOTE — ED Notes (Signed)
The pt is c/o a toothache for 2 days 

## 2014-06-16 NOTE — ED Provider Notes (Signed)
CSN: [id number]     Arrival date & time 06/16/14  0229 History   First MD Initiated Contact with Patient 06/16/14 [phone]     Chief Complaint  Patient presents with  . Dental Problem     (Consider location/radiation/quality/duration/timing/severity/associated sxs/prior Treatment) Patient is a 33 y.o. male presenting with tooth pain. The history is provided by the patient.  Dental Pain Location:  Upper Severity:  Moderate Onset quality:  Gradual Duration:  2 days Timing:  Constant Progression:  Worsening Chronicity:  New Relieved by:  Nothing Associated symptoms: no fever     PMH - none  History  Substance Use Topics  . Smoking status: Current Every Day Smoker  . Smokeless tobacco: Never Used  . Alcohol Use: No    Review of Systems  Constitutional: Negative for fever.  HENT: Positive for ear pain.   Gastrointestinal: Negative for vomiting.      Allergies  Review of patient's allergies indicates no known allergies.  Home Medications   Prior to Admission medications   Medication Sig Start Date End Date Taking? Authorizing Provider  penicillin v potassium (VEETID) 500 MG tablet Take 1 tablet (500 mg total) by mouth 4 (four) times daily. 06/16/14 06/23/14  Joya Gaskinsonald W , MD   BP 108/58  Pulse 68  Temp(Src) 97.7 F (36.5 C) (Oral)  Resp 16  SpO2 97% Physical Exam CONSTITUTIONAL: Well developed/well nourished HEAD AND FACE: Normocephalic/atraumatic EYES: EOMI/PERRL ENMT: Mucous membranes moist.  Poor dentition.  No trismus.  No focal abscess noted. Tenderness to left upper molar Left TM obscured due to cerumen NECK: supple no meningeal signs CV: S1/S2 noted, no murmurs/rubs/gallops noted LUNGS: Lungs are clear to auscultation bilaterally, no apparent distress ABDOMEN: soft, nontender, no rebound or guarding NEURO: Pt is awake/alert, moves all extremitiesx4 EXTREMITIES:full ROM SKIN: warm, color normal  ED Course  Procedures    MDM   Final diagnoses:   Pain, dental    Nursing notes including past medical history and social history reviewed and considered in documentation     Joya Gaskinsonald W , MD 06/16/14 [phone]

## 2014-06-16 NOTE — Discharge Instructions (Signed)

## 2014-07-14 ENCOUNTER — Emergency Department (HOSPITAL_COMMUNITY)
Admission: EM | Admit: 2014-07-14 | Discharge: 2014-07-15 | Disposition: A | Discharge status: Home / Self Care | Payer: Medicaid Other | Attending: Emergency Medicine | Admitting: Emergency Medicine

## 2014-07-14 ENCOUNTER — Encounter (HOSPITAL_COMMUNITY): Payer: Self-pay | Admitting: Emergency Medicine

## 2014-07-14 DIAGNOSIS — Z720 Tobacco use: Secondary | ICD-10-CM | POA: Insufficient documentation

## 2014-07-14 DIAGNOSIS — R0789 Other chest pain: Secondary | ICD-10-CM | POA: Diagnosis not present

## 2014-07-14 DIAGNOSIS — R079 Chest pain, unspecified: Secondary | ICD-10-CM | POA: Diagnosis present

## 2014-07-14 LAB — CBC WITH DIFFERENTIAL/PLATELET
Basophils Absolute: 0 10*3/uL (ref 0.0–0.1)
Basophils Relative: 1 % (ref 0–1)
EOS ABS: 0.3 10*3/uL (ref 0.0–0.7)
Eosinophils Relative: 5 % (ref 0–5)
HCT: 46.9 % (ref 39.0–52.0)
Hemoglobin: 15.7 g/dL (ref 13.0–17.0)
Lymphocytes Relative: 44 % (ref 12–46)
Lymphs Abs: 2.8 10*3/uL (ref 0.7–4.0)
MCH: 27.4 pg (ref 26.0–34.0)
MCHC: 33.5 g/dL (ref 30.0–36.0)
MCV: 81.7 fL (ref 78.0–100.0)
MONOS PCT: 7 % (ref 3–12)
Monocytes Absolute: 0.4 10*3/uL (ref 0.1–1.0)
Neutro Abs: 2.7 10*3/uL (ref 1.7–7.7)
Neutrophils Relative %: 43 % (ref 43–77)
Platelets: 142 10*3/uL — ABNORMAL LOW (ref 150–400)
RBC: 5.74 MIL/uL (ref 4.22–5.81)
RDW: 13.6 % (ref 11.5–15.5)
WBC: 6.3 10*3/uL (ref 4.0–10.5)

## 2014-07-14 LAB — I-STAT TROPONIN, ED: Troponin i, poc: 0.02 ng/mL (ref 0.00–0.08)

## 2014-07-14 MED ORDER — KETOROLAC TROMETHAMINE 60 MG/2ML IM SOLN
60.0000 mg | Freq: Once | INTRAMUSCULAR | Status: AC
  Administered 2014-07-15: 60 mg via INTRAMUSCULAR
  Filled 2014-07-14: qty 2

## 2014-07-14 NOTE — ED Notes (Signed)
The pt is c/o mid-chest pain for the past 2 holurs.  No sob no n v .  He did nt take any med to stop the pain.  No history

## 2014-07-14 NOTE — ED Notes (Signed)
MD at bedside. 

## 2014-07-15 ENCOUNTER — Emergency Department (HOSPITAL_COMMUNITY): Payer: Medicaid Other

## 2014-07-15 LAB — BASIC METABOLIC PANEL WITH GFR
Anion gap: 12 (ref 5–15)
BUN: 14 mg/dL (ref 6–23)
CO2: 23 meq/L (ref 19–32)
Calcium: 8.9 mg/dL (ref 8.4–10.5)
Chloride: 102 meq/L (ref 96–112)
Creatinine, Ser: 1.2 mg/dL (ref 0.50–1.35)
GFR calc Af Amer: 89 mL/min — ABNORMAL LOW
GFR calc non Af Amer: 77 mL/min — ABNORMAL LOW
Glucose, Bld: 150 mg/dL — ABNORMAL HIGH (ref 70–99)
Potassium: 4.3 meq/L (ref 3.7–5.3)
Sodium: 137 meq/L (ref 137–147)

## 2014-07-15 MED ORDER — NAPROXEN 500 MG PO TABS: 500.0000 mg | ORAL_TABLET | Freq: Two times a day (BID) | ORAL | Status: DC

## 2014-07-15 NOTE — ED Notes (Signed)
Discharged during downtime. Pt received dc instructions, all questions were answered. Pt ambulatory and in nad.

## 2014-07-15 NOTE — ED Provider Notes (Signed)
CSN: [id number]     Arrival date & time 07/14/14  2245 History   First MD Initiated Contact with Patient 07/14/14 2341     Chief Complaint  Patient presents with  . Chest Pain     (Consider location/radiation/quality/duration/timing/severity/associated sxs/prior Treatment) HPI Comments: Patient has had constant, sharp, midsternal chest pain without radiation since 2 PM today. Chest pain began at rest. It was improved somewhat by rest and deep breathing. No worsening symptoms. No associated symptoms including no associated nausea, vomiting, diaphoresis, shortness of breath, leg pain or swelling. He is a history of having pain in the past though states that this is worse. Pain is worse with a nondistended 10. Pain is currently 6 out of 10. His no history of hypertension, hyperlipidemia, diabetes mellitus. His no family history of coronary artery disease. He has had no history of PE or DVT. He is a smoker. He denies drug use.  Patient is a 33 y.o. male presenting with chest pain.  Chest Pain Associated symptoms: no abdominal pain, no back pain, no cough, no dizziness, no dysphagia, no fatigue, no fever, no headache, no nausea, no numbness, no shortness of breath, not vomiting and no weakness     History reviewed. No pertinent past medical history. History reviewed. No pertinent past surgical history. No family history on file. History  Substance Use Topics  . Smoking status: Current Every Day Smoker  . Smokeless tobacco: Never Used  . Alcohol Use: No    Review of Systems  Constitutional: Negative for fever, activity change, appetite change and fatigue.  HENT: Negative for congestion, facial swelling, rhinorrhea and trouble swallowing.   Eyes: Negative for photophobia and pain.  Respiratory: Negative for cough, chest tightness and shortness of breath.   Cardiovascular: Positive for chest pain. Negative for leg swelling.  Gastrointestinal: Negative for nausea, vomiting, abdominal pain,  diarrhea and constipation.  Endocrine: Negative for polydipsia and polyuria.  Genitourinary: Negative for dysuria, urgency, decreased urine volume and difficulty urinating.  Musculoskeletal: Negative for back pain and gait problem.  Skin: Negative for color change, rash and wound.  Allergic/Immunologic: Negative for immunocompromised state.  Neurological: Negative for dizziness, facial asymmetry, speech difficulty, weakness, numbness and headaches.  Psychiatric/Behavioral: Negative for confusion, decreased concentration and agitation.      Allergies  Review of patient's allergies indicates no known allergies.  Home Medications   Prior to Admission medications   Not on File   BP 122/74  Pulse 88  Temp(Src) 98.1 F (36.7 C) (Oral)  Resp 17  SpO2 98% Physical Exam  Constitutional: He is oriented to person, place, and time. He appears well-developed and well-nourished. No distress.  HENT:  Head: Normocephalic and atraumatic.  Mouth/Throat: No oropharyngeal exudate.  Eyes: Pupils are equal, round, and reactive to light.  Neck: Normal range of motion. Neck supple.  Cardiovascular: Normal rate, regular rhythm and normal heart sounds.  Exam reveals no gallop and no friction rub.   No murmur heard. Pulmonary/Chest: Effort normal and breath sounds normal. No respiratory distress. He has no wheezes. He has no rales.  Abdominal: Soft. Bowel sounds are normal. He exhibits no distension and no mass. There is no tenderness. There is no rebound and no guarding.  Musculoskeletal: Normal range of motion. He exhibits no edema and no tenderness.  Neurological: He is alert and oriented to person, place, and time.  Skin: Skin is warm and dry.  Psychiatric: He has a normal mood and affect.    ED Course  Procedures (  including critical care time) Labs Review Labs Reviewed  CBC WITH DIFFERENTIAL - Abnormal; Notable for the following:    Platelets 142 (*)    All other components within normal  limits  BASIC METABOLIC PANEL  I-STAT TROPOININ, ED    Imaging Review No results found.   EKG Interpretation   Date/Time:  Saturday July 14 2014 22:57:15 EDT Ventricular Rate:  78 PR Interval:  152 QRS Duration: 90 QT Interval:  344 QTC Calculation: 392 R Axis:   73 Text Interpretation:  Normal sinus rhythm Normal ECG Confirmed by WARD,   DO, KRISTEN ([id number]) on 07/14/2014 11:20:56 PM      MDM   Final diagnoses:  Chest pain    Pt is a 33 y.o. male with Pmhx as above who presents with Constant midsternal, sharp chest pain since about 2 PM today without exacerbating or alleviating symptoms. Pain started at rest. He has no associated shortness of breath, nausea, vomiting, diaphoresis, leg pain or swelling. TIMI score of 0, PERC negative. Low risk for MACE using heart [place] no acute ischemic changes seen on EKG. Troponin is negative. CBC and BMP are unremarkable. Patient given IM Toradol with good response. Do not feel that this pain is likely ischemic and troponin is reassuring given timing of chest pain. Patient will be discharged home with instructions for twice a day naproxen and return precautions for new or worsening symptoms including worsening pain, fever, shortness of breath.   Toy CookeyMegan Docherty, MD 07/16/14 [phone]

## 2014-07-15 NOTE — Discharge Instructions (Signed)

## 2015-08-18 ENCOUNTER — Encounter (HOSPITAL_COMMUNITY): Payer: Self-pay | Admitting: Emergency Medicine

## 2015-08-18 ENCOUNTER — Emergency Department (HOSPITAL_COMMUNITY)
Admission: EM | Admit: 2015-08-18 | Discharge: 2015-08-18 | Disposition: A | Discharge status: Home / Self Care | Payer: Medicaid Other | Attending: Emergency Medicine | Admitting: Emergency Medicine

## 2015-08-18 DIAGNOSIS — K029 Dental caries, unspecified: Secondary | ICD-10-CM | POA: Diagnosis not present

## 2015-08-18 DIAGNOSIS — F17200 Nicotine dependence, unspecified, uncomplicated: Secondary | ICD-10-CM | POA: Insufficient documentation

## 2015-08-18 DIAGNOSIS — K0889 Other specified disorders of teeth and supporting structures: Secondary | ICD-10-CM | POA: Diagnosis not present

## 2015-08-18 MED ORDER — PENICILLIN V POTASSIUM 500 MG PO TABS: 500.0000 mg | ORAL_TABLET | Freq: Four times a day (QID) | ORAL | Status: AC

## 2015-08-18 MED ORDER — TRAMADOL HCL 50 MG PO TABS: 50.0000 mg | ORAL_TABLET | Freq: Four times a day (QID) | ORAL | Status: DC | PRN

## 2015-08-18 MED ORDER — NAPROXEN 500 MG PO TABS: 500.0000 mg | ORAL_TABLET | Freq: Two times a day (BID) | ORAL | Status: DC

## 2015-08-18 NOTE — Discharge Instructions (Signed)
Dental Pain °Dental pain may be caused by many things, including: °· Tooth decay (cavities or caries). Cavities expose the nerve of your tooth to air and hot or cold temperatures. This can cause pain or discomfort. °· Abscess or infection. A dental abscess is a collection of infected pus from a bacterial infection in the inner part of the tooth (pulp). It usually occurs at the end of the tooth's root. °· Injury. °· An unknown reason (idiopathic). °Your pain may be mild or severe. It may only occur when: °· You are chewing. °· You are exposed to hot or cold temperature. °· You are eating or drinking sugary foods or beverages, such as soda or candy. °Your pain may also be constant. °HOME CARE INSTRUCTIONS °Watch your dental pain for any changes. The following actions may help to lessen any discomfort that you are feeling: °· Take medicines only as directed by your dentist. °· If you were prescribed an antibiotic medicine, finish all of it even if you start to feel better. °· Keep all follow-up visits as directed by your dentist. This is important. °· Do not apply heat to the outside of your face. °· Rinse your mouth or gargle with salt water if directed by your dentist. This helps with pain and swelling. °¨ You can make salt water by adding ¼ tsp of salt to 1 cup of warm water. °· Apply ice to the painful area of your face: °¨ Put ice in a plastic bag. °¨ Place a towel between your skin and the bag. °¨ Leave the ice on for 20 minutes, 2-3 times per day. °· Avoid foods or drinks that cause you pain, such as: °¨ Very hot or very cold foods or drinks. °¨ Sweet or sugary foods or drinks. °SEEK MEDICAL CARE IF: °· Your pain is not controlled with medicines. °· Your symptoms are worse. °· You have new symptoms. °SEEK IMMEDIATE MEDICAL CARE IF: °· You are unable to open your mouth. °· You are having trouble breathing or swallowing. °· You have a fever. °· Your face, neck, or jaw is swollen. °  °This information is not  intended to replace advice given to you by your health care provider. Make sure you discuss any questions you have with your health care provider. °  °Document Released: 08/31/2005 Document Revised: 01/15/2015 Document Reviewed: 08/27/2014 °Elsevier Interactive Patient Education ©2016 Elsevier Inc. ° °Emergency Department Resource Guide °1) Find a Doctor and Pay Out of Pocket °Although you won't have to find out who is covered by your insurance plan, it is a good idea to ask around and get recommendations. You will then need to call the office and see if the doctor you have chosen will accept you as a new patient and what types of options they offer for patients who are self-pay. Some doctors offer discounts or will set up payment plans for their patients who do not have insurance, but you will need to ask so you aren't surprised when you get to your appointment. ° °2) Contact Your Local Health Department °Not all health departments have doctors that can see patients for sick visits, but many do, so it is worth a call to see if yours does. If you don't know where your local health department is, you can check in your phone book. The CDC also has a tool to help you locate your state's health department, and many state websites also have listings of all of their local health departments. ° °3) Find   a Walk-in Clinic °If your illness is not likely to be very severe or complicated, you may want to try a walk in clinic. These are popping up all over the country in pharmacies, drugstores, and shopping centers. They're usually staffed by nurse practitioners or physician assistants that have been trained to treat common illnesses and complaints. They're usually fairly quick and inexpensive. However, if you have serious medical issues or chronic medical problems, these are probably not your best option. ° °No Primary Care Doctor: °- Call [facility] at  832-8000 - they can help you locate a primary care doctor that   accepts your insurance, provides certain services, etc. °- Physician Referral Service- 1-800-533-3463 ° °Chronic Pain Problems: °Organization         Address  Phone   Notes  °[facility]  ([phone]) [phone] Patients need to be referred by their primary care doctor.  ° °Medication Assistance: °Organization         Address  Phone   Notes  °[place] [facility] [address] °[place], [place] [address] (336) 641-8030 --Must be a resident of [place] °-- Must have NO insurance coverage whatsoever (no Medicaid/ Medicare, etc.) °-- The pt. MUST have a primary care doctor that directs their care regularly and follows them in the community °  °[facility]  (866) 331-1348   °[employer]  (888) 892-1162   ° °Agencies that provide inexpensive medical care: °Organization         Address  Phone   Notes  °[facility]  ([phone]) [phone]   ° Internal Medicine    (336) 832-7272   °[facility] [address] °[place], [place] [address] (336) 832-4777   °[facility] [address], °[place] (336) 271-4999   °[facility]    (336) 373-0678   °[facility]    (336) 272-1050   °[facility] ° [address], [place] Phone:  (336) 832-4444, Fax:  (336) 832-4440 Hours of Operation:  9 am - 6 pm, M-F.  Also accepts Medicaid/Medicare and self-pay.  °[facility] ° [address], [place] Phone: (336) [phone], Fax: (336) [phone]. Hours of Operation:  8:30 am - 5:30 pm, M-F.  Also accepts Medicaid and self-pay.  °[facility] [place] [address], [place] Phone: (336) [phone]   °[facility] [address], [place], [place] (336)723-1848, Ext. 123 Mondays & Thursdays: 7-9 AM.  First 15 patients are seen on a first come, first serve basis. °  ° °Medicaid-accepting [place] Providers: ° °Organization          Address  Phone   Notes  °[facility] [address], [place] ([phone]) [phone] Also accepts self-pay patients.  °[facility] [address], [place] ° (336) [phone]   °[facility] [address], [place] (336) [phone]   °[facility] [address] [place] [address], [place] (336) [phone]   °[facility] [address],   ° (336) [phone] Only accepts [place] Access Medicaid patients after they have their name applied to their card.  ° °Self-Pay (no insurance) in [place]: ° °Organization         Address  Phone   Notes  °Sickle Cell Patients, [facility] [address], [place] ([phone]) [phone]   °[facility] [address], [place] (336) [phone]   ° [facility] ° [address], [place] (336) [phone]   °[facility]/Dr. Osei-Bonsu ° [address] [place] [address], [place] or [address], [place] ([phone]) [phone] Phone number for both [place] and [place] locations is the same.  °[facility] [address], [place] (336) [phone]   °[facility] [address] [place] [address], [place] or [address] (336) 852-7530 °(336) 878-2260   °[facility] [address], [place] (336) 350-1642, phone; (336) 294-5005, fax Sees patients 1st and 3rd Saturday of every month.  Must not qualify for public or private insurance (i.e. Medicaid, Medicare, [place] Health Choice, Veterans' Benefits) • Household income should be no more than 200% of the poverty level •The clinic cannot treat you if you are pregnant or think you are pregnant • Sexually transmitted diseases are not treated at the clinic.  ° ° °Dental Care: °Organization         Address  Phone  Notes  °[facility] [address],  [place] (336) 641-6152 Accepts children up to age 21 who are enrolled in Medicaid or [place] Health Choice; pregnant women with a Medicaid card; and children who have applied for Medicaid or [place] Health Choice, but were declined, whose parents can pay a reduced fee at time of service.  °[facility] [place]  [address], [place] (336) 641-7733 Accepts children up to age 21 who are enrolled in Medicaid or [place] Health Choice; pregnant women with a Medicaid card; and children who have applied for Medicaid or [place] Health Choice, but were declined, whose parents can pay a reduced fee at time of service.  °[facility] ° [address], [place] (336) [phone] Patients are seen by appointment only. Walk-ins are not accepted. [facility] will see patients 18 years of age and older. °Monday - Tuesday (8am-5pm) °Most Wednesdays (8:30-5pm) °$30 per visit, cash only  °[facility] ° [address], [place] (336) [phone] Patients are seen by appointment only. Walk-ins are not accepted. [facility] will see patients 18 years of age and older. °One Wednesday Evening (Monthly: Volunteer Based).  $30 per visit, cash only  °[facility]  (919) [phone] for adults; Children under age 4, call Graduate Pediatric Dentistry at ([phone]) [phone]. Children aged 4-14, please call ([phone]) [phone] to request a pediatric application. ° Dental services are provided in all areas of dental care including fillings, crowns and bridges, complete and partial dentures, implants, gum treatment, root canals, and extractions. Preventive care is also provided. Treatment is provided to both adults and children. °Patients are selected via a lottery and there is often a waiting list. °  °[facility] [address], °[place] ° (336) 763-8833 [email or web address] °  °[facility] [address], [place], [place]  (336)723-1848, Ext. 123 Second and Fourth Thursday of each month, opens at 6:30 AM; Clinic ends at 9 AM.  Patients are seen on a first-come first-served basis, and a limited number are seen during each clinic.  ° °[facility] ° [address], [place], [place] ([phone]) [phone]   Eligibility Requirements °You must have lived in [place], [place], or   [place] counties for at least the last three months. °  You cannot be eligible for state or federal sponsored healthcare insurance, including Veterans Administration, Medicaid, or Medicare. °  You generally cannot be eligible for healthcare insurance through your employer.  °  How to apply: °Eligibility screenings are held every Tuesday and Wednesday afternoon from 1:00 pm until 4:00 pm. You do not need an appointment for the interview!  °[facility] [address], [place], [place] [phone]   °[facility]  [phone]   °[facility]  [phone]   ° [facility]  [phone]   ° °

## 2015-08-18 NOTE — ED Provider Notes (Signed)
CSN: [id number]     Arrival date & time 08/18/15  0219 History   First MD Initiated Contact with Patient 08/18/15 0324     Chief Complaint  Patient presents with  . Dental Pain     (Consider location/radiation/quality/duration/timing/severity/associated sxs/prior Treatment) Patient is a 34 y.o. male presenting with tooth pain. The history is provided by the patient. No language interpreter was used.  Dental Pain Location:  Upper and lower Upper teeth location:  12/LU 1st bicuspid Lower teeth location:  31/RL 2nd molar Quality:  Aching and throbbing Severity:  Moderate Onset quality:  Gradual Duration:  2 weeks Progression:  Waxing and waning Chronicity:  New Context: dental caries and poor dentition   Context: not trauma   Relieved by:  Nothing Worsened by:  Touching Ineffective treatments:  Acetaminophen and NSAIDs Associated symptoms: facial pain   Associated symptoms: no difficulty swallowing, no facial swelling, no fever, no oral bleeding and no trismus   Risk factors: lack of dental care and smoking     History reviewed. No pertinent past medical history. History reviewed. No pertinent past surgical history. No family history on file. Social History  Substance Use Topics  . Smoking status: Current Every Day Smoker  . Smokeless tobacco: Never Used  . Alcohol Use: No    Review of Systems  Constitutional: Negative for fever.  HENT: Positive for dental problem. Negative for facial swelling.   All other systems reviewed and are negative.   Allergies  Review of patient's allergies indicates no known allergies.  Home Medications   Prior to Admission medications   Medication Sig Start Date End Date Taking? Authorizing Provider  naproxen (NAPROSYN) 500 MG tablet Take 1 tablet (500 mg total) by mouth 2 (two) times daily. 08/18/15   Antony Madura, PA-C  penicillin v potassium (VEETID) 500 MG tablet Take 1 tablet (500 mg total) by mouth 4 (four) times daily. 08/18/15  08/25/15  Antony Madura, PA-C  traMADol (ULTRAM) 50 MG tablet Take 1 tablet (50 mg total) by mouth every 6 (six) hours as needed. 08/18/15   Antony Madura, PA-C   BP 112/74 mmHg  Pulse 86  Temp(Src) 98.8 F (37.1 C) (Oral)  Resp 16  Ht  (1.905 m)  Wt 108.863 kg  BMI 30.00 kg/m2  SpO2 96%   Physical Exam  Constitutional: He is oriented to person, place, and time. He appears well-developed and well-nourished. No distress.  Nontoxic/nonseptic appearing  HENT:  Head: Normocephalic and atraumatic.  Mouth/Throat: Uvula is midline and oropharynx is clear and moist. No trismus in the jaw. Abnormal dentition. Dental caries present. No uvula swelling.    No gingival swelling or fluctuance. No trismus. Patient tolerating secretions without difficulty.  Eyes: Conjunctivae and EOM are normal. No scleral icterus.  Neck: Normal range of motion.  No nuchal rigidity or meningismus  Pulmonary/Chest: Effort normal. No respiratory distress.  Respirations even and unlabored  Musculoskeletal: Normal range of motion.  Neurological: He is alert and oriented to person, place, and time. He exhibits normal muscle tone. Coordination normal.  Skin: Skin is warm and dry. No rash noted. He is not diaphoretic. No erythema. No pallor.  Psychiatric: He has a normal mood and affect. His behavior is normal.  Nursing note and vitals reviewed.   ED Course  Procedures (including critical care time) Labs Review Labs Reviewed - No data to display  Imaging Review No results found.   I have personally reviewed and evaluated these images and lab results as  part of my medical decision-making.   EKG Interpretation None      MDM   Final diagnoses:  Dentalgia    Patient with toothache x 2 weeks. No gross abscess. Exam unconcerning for Ludwig's angina or spread of infection. Will treat with penicillin and pain medicine. Urged patient to follow-up with dentist. Return precautions and resource guide provided.  Patient discharged in good condition with no unaddressed concerns.   Filed Vitals:   08/18/15 0226 08/18/15 0336  BP: 126/71 112/74  Pulse: 96 86  Temp: 98.8 F (37.1 C)   TempSrc: Oral   Resp: 16   Height: 6\' 3"  (1.905 m)   Weight: 108.863 kg   SpO2: 96% 96%      Antony MaduraKelly , PA-C 08/18/15 0348  Azalia BilisKevin Campos, MD 08/18/15 [phone]

## 2015-08-18 NOTE — ED Notes (Signed)
MD at bedside. 

## 2015-08-18 NOTE — ED Notes (Signed)
Pt. reports left upper and right lower molar pain for 2 weeks unrelieved by OTC pain medication .

## 2015-08-18 NOTE — ED Notes (Signed)
Pt departed in NAD.  

## 2016-07-07 ENCOUNTER — Emergency Department (HOSPITAL_COMMUNITY)
Admission: EM | Admit: 2016-07-07 | Discharge: 2016-07-07 | Disposition: A | Discharge status: Home / Self Care | Payer: Medicaid Other | Attending: Emergency Medicine | Admitting: Emergency Medicine

## 2016-07-07 ENCOUNTER — Encounter (HOSPITAL_COMMUNITY): Payer: Self-pay

## 2016-07-07 DIAGNOSIS — Z79899 Other long term (current) drug therapy: Secondary | ICD-10-CM | POA: Diagnosis not present

## 2016-07-07 DIAGNOSIS — R300 Dysuria: Secondary | ICD-10-CM | POA: Diagnosis not present

## 2016-07-07 DIAGNOSIS — F17210 Nicotine dependence, cigarettes, uncomplicated: Secondary | ICD-10-CM | POA: Diagnosis not present

## 2016-07-07 DIAGNOSIS — N4889 Other specified disorders of penis: Secondary | ICD-10-CM | POA: Insufficient documentation

## 2016-07-07 DIAGNOSIS — R238 Other skin changes: Secondary | ICD-10-CM

## 2016-07-07 MED ORDER — NYSTATIN 100000 UNIT/GM EX CREA: TOPICAL_CREAM | CUTANEOUS | 0 refills | Status: DC

## 2016-07-07 MED ORDER — NYSTATIN 100000 UNIT/GM EX CREA: TOPICAL_CREAM | CUTANEOUS | 1 refills | Status: DC

## 2016-07-07 NOTE — ED Provider Notes (Signed)
[facility]-EMERGENCY DEPT Provider Note   CSN: [id number] Arrival date & time: 07/07/16  1402  By signing my name below, I, Octavia Heir, attest that this documentation has been prepared under the direction and in the presence of [employer], PA-C.  Electronically Signed: Octavia Heir, ED Scribe. 07/07/16. 3:11 PM.    History   Chief Complaint Chief Complaint  Patient presents with  . Penile Problem   The history is provided by the patient. No language interpreter was used.   HPI Comments: Richard Turner is a 35 y.o. male who presents to the Emergency Department complaining of acute onset, recurring cracked foreskin of the penis that started two days ago. Pt is uncircumcised and he reports having this frequent problem. He reports associated burning with urination secondary to the crack in the area. Pt was seen at [facility] a few months ago for same symptoms and received Nystatin to relieve his symptoms with relief. He reports using same treatment last night but it has not given him any relief like it normally does. He reports no change in his sexual activity that could possibly cause change. Denies penile discharge, hematuria, or any other urinary symptoms.  History reviewed. No pertinent past medical history.  There are no active problems to display for this patient.   History reviewed. No pertinent surgical history.     Home Medications    Prior to Admission medications   Medication Sig Start Date End Date Taking? Authorizing Provider  naproxen (NAPROSYN) 500 MG tablet Take 1 tablet (500 mg total) by mouth 2 (two) times daily. 08/18/15   Antony Madura, PA-C  nystatin cream (MYCOSTATIN) Apply to affected area 2 times daily 07/07/16   Eyvonne Mechanic, PA-C  traMADol (ULTRAM) 50 MG tablet Take 1 tablet (50 mg total) by mouth every 6 (six) hours as needed. 08/18/15   Antony Madura, PA-C    Family History History reviewed. No pertinent family history.  Social History Social History   Substance Use Topics  . Smoking status: Current Every Day Smoker    Types: Cigarettes  . Smokeless tobacco: Never Used  . Alcohol use No     Allergies   Review of patient's allergies indicates no known allergies.   Review of Systems Review of Systems  Genitourinary: Positive for dysuria and penile pain. Negative for discharge and hematuria.  All other systems reviewed and are negative.    Physical Exam Updated Vital Signs BP 117/81 (BP Location: Left Arm)   Pulse 72   Temp 97.8 F (36.6 C) (Oral)   Resp 18   SpO2 99%   Physical Exam  Constitutional: He is oriented to person, place, and time. He appears well-developed and well-nourished.  HENT:  Head: Normocephalic and atraumatic.  Right Ear: External ear normal.  Left Ear: External ear normal.  Eyes: Conjunctivae are normal. No scleral icterus.  Neck: No tracheal deviation present.  Pulmonary/Chest: Effort normal. No respiratory distress.  Abdominal: He exhibits no distension.  Genitourinary:  Genitourinary Comments: Linear crack and dryness noted along the foreskin. No discharge or surrounding redness  Musculoskeletal: Normal range of motion.  Neurological: He is alert and oriented to person, place, and time.  Skin: Skin is warm and dry.  Psychiatric: He has a normal mood and affect. His behavior is normal.  Nursing note and vitals reviewed.    ED Treatments / Results  DIAGNOSTIC STUDIES: Oxygen Saturation is 99% on RA, normal by my interpretation.  COORDINATION OF CARE:  3:08 PM Discussed treatment plan  with pt at bedside and pt agreed to plan.  Labs (all labs ordered are listed, but only abnormal results are displayed) Labs Reviewed - No data to display  EKG  EKG Interpretation None       Radiology No results found.  Procedures Procedures (including critical care time)  Medications Ordered in ED Medications - No data to display   Initial Impression / Assessment and Plan / ED Course  I  have reviewed the triage vital signs and the nursing notes.  Pertinent labs & imaging results that were available during my care of the patient were reviewed by me and considered in my medical decision making (see chart for details).  Clinical Course     Final Clinical Impressions(s) / ED Diagnoses   Final diagnoses:  Skin irritation   Labs:  Imaging:  Consults:  Therapeutics:  Discharge Meds: Nystatin  Assessment/Plan:Patient appears to have irritation of the foreskin, no signs of infectious etiology. He reports symptoms are improved with nystatin in the past. Pt was advised to use Aquaphor on the area for a few days and then incorporate Nystatin if his symptoms do not get any better. Pt will receive referral to PCP's in the area for proper follow up.  I personally performed the services described in this documentation, which was scribed in my presence. The recorded information has been reviewed and is accurate.   New Prescriptions New Prescriptions   NYSTATIN CREAM (MYCOSTATIN)    Apply to affected area 2 times daily     Eyvonne MechanicJeffrey , PA-C 07/07/16 1515    Nira ConnPedro Eduardo Cardama, MD 07/07/16 2152

## 2016-07-07 NOTE — ED Triage Notes (Signed)
Pt c/o "tiny cuts" on foreskin x "a few days ago."  Pain score 7/10.  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  [facility] and prescribed nystatin.  Denies penile discharge.

## 2016-07-07 NOTE — Discharge Instructions (Signed)
Please read attached information. If you experience any new or worsening signs or symptoms please return to the emergency room for evaluation. Please follow-up with your primary care provider or specialist as discussed. Please use medication prescribed only as directed and discontinue taking if you have any concerning signs or symptoms.   °

## 2019-04-04 ENCOUNTER — Other Ambulatory Visit: Payer: Self-pay | Admitting: Orthopedic Surgery

## 2019-04-07 NOTE — Progress Notes (Signed)
[id number] - [place], [place] [address]. AT [address] [place] [address] Phone: [phone] Fax: [phone]    Your procedure is scheduled on Thursday, July 30th.  Report to [facility] Main Entrance "A" at 9:35 A.M., and check in at the Admitting office.  Call this number if you have problems the morning of surgery:  [phone]  Call [phone] if you have any questions prior to your surgery date Monday-Friday 8am-4pm   Remember:  Do not eat after midnight the night before your surgery  You may drink clear liquids until 9:35 the morning of your surgery.   Clear liquids allowed are: Water, Non-Citrus Juices (without pulp), Carbonated Beverages, Clear Tea, Black Coffee Only, and Gatorade    Take these medicines the morning of surgery with A SIP OF WATER   If needed - oxyCODONE-acetaminophen (PERCOCET/ROXICET)   7 days prior to surgery STOP taking any Aspirin (unless otherwise instructed by your surgeon), diclofenac (VOLTAREN),  Aleve, Naproxen, Ibuprofen, Motrin, Advil, Goody's, BC's, all herbal medications, fish oil, and all vitamins.   The Morning of Surgery  Do not wear jewelry, make-up or nail polish.  Do not wear lotions, powders, or perfumes/colognes, or deodorant  Do not shave 48 hours prior to surgery.  Men may shave face and neck.  Do not bring valuables to the hospital.  [facility] is not responsible for any belongings or valuables.  If you are a smoker, DO NOT Smoke 24 hours prior to surgery IF you wear a CPAP at night please bring your mask, tubing, and machine the morning of surgery   Remember that you must have someone to transport you home after your surgery, and remain with you for 24 hours if you are discharged the same day.  Contacts, glasses, hearing aids, dentures or bridgework may not be worn into surgery.   Leave your suitcase in the car.  After surgery it may be brought to your  room.  For patients admitted to the hospital, discharge time will be determined by your treatment team.  Patients discharged the day of surgery will not be allowed to drive home.   Special instructions:   [facility]- Preparing For Surgery  Before surgery, you can play an important role. Because skin is not sterile, your skin needs to be as free of germs as possible. You can reduce the number of germs on your skin by washing with CHG (chlorahexidine gluconate) Soap before surgery.  CHG is an antiseptic cleaner which kills germs and bonds with the skin to continue killing germs even after washing.    Oral Hygiene is also important to reduce your risk of infection.  Remember - BRUSH YOUR TEETH THE MORNING OF SURGERY WITH YOUR REGULAR TOOTHPASTE  Please do not use if you have an allergy to CHG or antibacterial soaps. If your skin becomes reddened/irritated stop using the CHG.  Do not shave (including legs and underarms) for at least 48 hours prior to first CHG shower. It is OK to shave your face.  Please follow these instructions carefully.   1. Shower the NIGHT BEFORE SURGERY and the MORNING OF SURGERY with CHG Soap.   2. If you chose to wash your hair, wash your hair first as usual with your normal shampoo.  3. After you shampoo, rinse your hair and body thoroughly to remove the shampoo.  4. Use CHG as you would any other liquid soap. You can apply CHG directly to the skin  and wash gently with a scrungie or a clean washcloth.   5. Apply the CHG Soap to your body ONLY FROM THE NECK DOWN.  Do not use on open wounds or open sores. Avoid contact with your eyes, ears, mouth and genitals (private parts). Wash Face and genitals (private parts)  with your normal soap.   6. Wash thoroughly, paying special attention to the area where your surgery will be performed.  7. Thoroughly rinse your body with warm water from the neck down.  8. DO NOT shower/wash with your normal soap after using and  rinsing off the CHG Soap.  9. Pat yourself dry with a CLEAN TOWEL.  10. Wear CLEAN PAJAMAS to bed the night before surgery, wear comfortable clothes the morning of surgery  11. Place CLEAN SHEETS on your bed the night of your first shower and DO NOT SLEEP WITH PETS.  Day of Surgery:  Do not apply any deodorants/lotions. Please shower the morning of surgery with the CHG soap  Please wear clean clothes to the hospital/surgery center.   Remember to brush your teeth WITH YOUR REGULAR TOOTHPASTE.  Please read over the following fact sheets that you were given.

## 2019-04-10 ENCOUNTER — Other Ambulatory Visit: Payer: Self-pay

## 2019-04-10 ENCOUNTER — Inpatient Hospital Stay (HOSPITAL_COMMUNITY): Admission: RE | Admit: 2019-04-10 | Payer: Self-pay | Source: Ambulatory Visit

## 2019-04-10 ENCOUNTER — Encounter (HOSPITAL_COMMUNITY)
Admission: RE | Admit: 2019-04-10 | Discharge: 2019-04-10 | Disposition: A | Discharge status: Home / Self Care | Payer: 59 | Source: Ambulatory Visit | Attending: Orthopedic Surgery | Admitting: Orthopedic Surgery

## 2019-04-10 ENCOUNTER — Encounter (HOSPITAL_COMMUNITY): Payer: Self-pay

## 2019-04-10 DIAGNOSIS — Z791 Long term (current) use of non-steroidal anti-inflammatories (NSAID): Secondary | ICD-10-CM | POA: Diagnosis not present

## 2019-04-10 DIAGNOSIS — F17210 Nicotine dependence, cigarettes, uncomplicated: Secondary | ICD-10-CM | POA: Diagnosis not present

## 2019-04-10 DIAGNOSIS — M5116 Intervertebral disc disorders with radiculopathy, lumbar region: Secondary | ICD-10-CM | POA: Diagnosis present

## 2019-04-10 DIAGNOSIS — M1990 Unspecified osteoarthritis, unspecified site: Secondary | ICD-10-CM | POA: Diagnosis not present

## 2019-04-10 DIAGNOSIS — Z20828 Contact with and (suspected) exposure to other viral communicable diseases: Secondary | ICD-10-CM | POA: Diagnosis not present

## 2019-04-10 DIAGNOSIS — Z01812 Encounter for preprocedural laboratory examination: Secondary | ICD-10-CM | POA: Insufficient documentation

## 2019-04-10 DIAGNOSIS — Z20822 Contact with and (suspected) exposure to covid-19: Secondary | ICD-10-CM

## 2019-04-10 HISTORY — DX: Unspecified osteoarthritis, unspecified site: M19.90

## 2019-04-10 LAB — COMPREHENSIVE METABOLIC PANEL
ALT: 17 U/L (ref 0–44)
AST: 22 U/L (ref 15–41)
Albumin: 3.9 g/dL (ref 3.5–5.0)
Alkaline Phosphatase: 41 U/L (ref 38–126)
Anion gap: 8 (ref 5–15)
BUN: 13 mg/dL (ref 6–20)
CO2: 23 mmol/L (ref 22–32)
Calcium: 9 mg/dL (ref 8.9–10.3)
Chloride: 105 mmol/L (ref 98–111)
Creatinine, Ser: 1.25 mg/dL — ABNORMAL HIGH (ref 0.61–1.24)
GFR calc Af Amer: >60 mL/min (ref >60)
GFR calc non Af Amer: >60 mL/min (ref >60)
Glucose, Bld: 104 mg/dL — ABNORMAL HIGH (ref 70–99)
Potassium: 4.4 mmol/L (ref 3.5–5.1)
Sodium: 136 mmol/L (ref 135–145)
Total Bilirubin: 0.6 mg/dL (ref 0.3–1.2)
Total Protein: 5.9 g/dL — ABNORMAL LOW (ref 6.5–8.1)

## 2019-04-10 LAB — CBC WITH DIFFERENTIAL/PLATELET
Abs Immature Granulocytes: 0.04 10*3/uL (ref 0.00–0.07)
Basophils Absolute: 0.1 10*3/uL (ref 0.0–0.1)
Basophils Relative: 1 %
Eosinophils Absolute: 0.4 10*3/uL (ref 0.0–0.5)
Eosinophils Relative: 6 %
HCT: 49.8 % (ref 39.0–52.0)
Hemoglobin: 15.6 g/dL (ref 13.0–17.0)
Immature Granulocytes: 1 %
Lymphocytes Relative: 45 %
Lymphs Abs: 2.9 10*3/uL (ref 0.7–4.0)
MCH: 27.1 pg (ref 26.0–34.0)
MCHC: 31.3 g/dL (ref 30.0–36.0)
MCV: 86.5 fL (ref 80.0–100.0)
Monocytes Absolute: 0.7 10*3/uL (ref 0.1–1.0)
Monocytes Relative: 10 %
Neutro Abs: 2.4 10*3/uL (ref 1.7–7.7)
Neutrophils Relative %: 37 %
Platelets: 155 10*3/uL (ref 150–400)
RBC: 5.76 MIL/uL (ref 4.22–5.81)
RDW: 14 % (ref 11.5–15.5)
WBC: 6.5 10*3/uL (ref 4.0–10.5)
nRBC: 0 % (ref 0.0–0.2)

## 2019-04-10 LAB — URINALYSIS, ROUTINE W REFLEX MICROSCOPIC
Bilirubin Urine: NEGATIVE
Glucose, UA: NEGATIVE mg/dL
Hgb urine dipstick: NEGATIVE
Ketones, ur: NEGATIVE mg/dL
Leukocytes,Ua: NEGATIVE
Nitrite: NEGATIVE
Protein, ur: NEGATIVE mg/dL
Specific Gravity, Urine: 1.021 (ref 1.005–1.030)
pH: 5 (ref 5.0–8.0)

## 2019-04-10 LAB — PROTIME-INR
INR: 1.1 (ref 0.8–1.2)
Prothrombin Time: 13.8 seconds (ref 11.4–15.2)

## 2019-04-10 LAB — APTT: aPTT: 28 seconds (ref 24–36)

## 2019-04-10 LAB — SURGICAL PCR SCREEN
MRSA, PCR: NEGATIVE
Staphylococcus aureus: NEGATIVE

## 2019-04-10 NOTE — Progress Notes (Signed)
[id number] - [place], [place] - [address] [place] [id number] Phone: [phone] Fax: [phone]               Your procedure is scheduled on Thursday, July 30th.             Report to [facility] Main Entrance "A" at 9:30 A.M., and check in at the Admitting office.             Call this number if you have problems the morning of surgery:             [phone]  Call [phone] if you have any questions prior to your surgery date Monday-Friday 8am-4pm              Remember:             Do not eat after midnight the night before your surgery  You may drink clear liquids until 9:30 the morning of your surgery.   Clear liquids allowed are: Pre-surgery Ensure, Water, Non-Citrus Juices (without pulp), Carbonated Beverages, Clear Tea, Black Coffee Only, and Gatorade   Please complete your PRE-SURGERY ENSURE that was provided to you by 930 AM the morning of surgery.  Please, if able, drink it in one setting. DO NOT SIP.                         Take these medicines the morning of surgery with A SIP OF WATER   If needed - oxyCODONE-acetaminophen (PERCOCET/ROXICET)   Beginning now, STOP taking any Aspirin (unless otherwise instructed by your surgeon), diclofenac (VOLTAREN),  Aleve, Naproxen, Ibuprofen, Motrin, Advil, Goody's, BC's, all herbal medications, fish oil, and all vitamins.                           The Morning of Surgery             Do not wear jewelry             Do not wear lotions, powders, or colognes, or deodorant             Men may shave face and neck.             Do not bring valuables to the hospital.             [facility] is not responsible for any belongings or valuables.  If you are a smoker, DO NOT Smoke 24 hours prior to surgery IF you wear a CPAP at night please bring your mask, tubing, and machine the morning of surgery   Remember that you must have someone to transport you  home after your surgery, and remain with you for 24 hours if you are discharged the same day.  Contacts, glasses, hearing aids, dentures or bridgework may not be worn into surgery.   Leave your suitcase in the car.  After surgery it may be brought to your room.  For patients admitted to the hospital, discharge time will be determined by your treatment team.  Patients discharged the day of surgery will not be allowed to drive home.   Special instructions:   [facility]- Preparing For Surgery  Before surgery, you can play an important role. Because skin is not sterile, your skin needs to be as free of germs as possible. You can reduce the number of germs on your skin  by washing with CHG (chlorahexidine gluconate) Soap before surgery.  CHG is an antiseptic cleaner which kills germs and bonds with the skin to continue killing germs even after washing.    Oral Hygiene is also important to reduce your risk of infection.  Remember - BRUSH YOUR TEETH THE MORNING OF SURGERY WITH YOUR REGULAR TOOTHPASTE  Please do not use if you have an allergy to CHG or antibacterial soaps. If your skin becomes reddened/irritated stop using the CHG.  Do not shave (including legs and underarms) for at least 48 hours prior to first CHG shower. It is OK to shave your face.  Please follow these instructions carefully.                                                                                                                               1. Shower the NIGHT BEFORE SURGERY and the MORNING OF SURGERY with CHG Soap.   2. If you chose to wash your hair, wash your hair first as usual with your normal shampoo.  3. After you shampoo, rinse your hair and body thoroughly to remove the shampoo.  4. Use CHG as you would any other liquid soap. You can apply CHG directly to the skin and wash gently with a scrungie or a clean washcloth.   5. Apply the CHG Soap to your body ONLY FROM THE NECK DOWN.  Do not use on  open wounds or open sores. Avoid contact with your eyes, ears, mouth and genitals (private parts). Wash Face and genitals (private parts)  with your normal soap.   6. Wash thoroughly, paying special attention to the area where your surgery will be performed.  7. Thoroughly rinse your body with warm water from the neck down.  8. DO NOT shower/wash with your normal soap after using and rinsing off the CHG Soap.  9. Pat yourself dry with a CLEAN TOWEL.  10. Wear CLEAN PAJAMAS to bed the night before surgery, wear comfortable clothes the morning of surgery  11. Place CLEAN SHEETS on your bed the night of your first shower and DO NOT SLEEP WITH PETS.  Day of Surgery:  Do not apply any deodorants/lotions. Please shower the morning of surgery with the CHG soap  Please wear clean clothes to the hospital/surgery center.   Remember to brush your teeth WITH YOUR REGULAR TOOTHPASTE.  Please read over the following fact sheets that you were given.

## 2019-04-10 NOTE — Progress Notes (Addendum)
PCP - pt denies Cardiologist - pt denies  Chest x-ray - pt denies EKG - pt denies  Stress Test - pt denies ECHO - pt denies   Cardiac Cath - pt denies  Sleep Study - pt denies CPAP - n/a  Fasting Blood Sugar - n/a Checks Blood Sugar _____ times a day-n/a  Blood Thinner Instructions: n/a Aspirin Instructions: n/a  Anesthesia review: No  Patient denies shortness of breath, fever, cough and chest pain at PAT appointment  Patient verbalized understanding of instructions that were given to them at the PAT appointment. Patient was also instructed that they will need to review over the PAT instructions again at home before surgery.   Coronavirus Screening  Have you experienced the following symptoms:  Cough yes/no: No Fever (>100.80F)  yes/no: No Runny nose yes/no: No Sore throat yes/no: No Difficulty breathing/shortness of breath  yes/no: No  Have you or a family member traveled in the last 14 days and where? yes/no: No   If the patient indicates "YES" to the above questions, their PAT will be rescheduled to limit the exposure to others and, the surgeon will be notified. THE PATIENT WILL NEED TO BE ASYMPTOMATIC FOR 14 DAYS.   If the patient is not experiencing any of these symptoms, the PAT nurse will instruct them to NOT bring anyone with them to their appointment since they may have these symptoms or traveled as well.   Please remind your patients and families that hospital visitation restrictions are in effect and the importance of the restrictions.

## 2019-04-11 ENCOUNTER — Other Ambulatory Visit (HOSPITAL_COMMUNITY)
Admission: RE | Admit: 2019-04-11 | Discharge: 2019-04-11 | Disposition: A | Discharge status: Home / Self Care | Payer: 59 | Source: Ambulatory Visit | Attending: Orthopedic Surgery | Admitting: Orthopedic Surgery

## 2019-04-11 DIAGNOSIS — M5116 Intervertebral disc disorders with radiculopathy, lumbar region: Secondary | ICD-10-CM | POA: Diagnosis not present

## 2019-04-11 NOTE — Progress Notes (Signed)
Unable to leave voice message for Richard Turner to inquire about "No show" for Covid 19 testing prior to procedure on 04/13/19 at [facility] Butch Penny at [facility] office aware.

## 2019-04-12 LAB — NOVEL CORONAVIRUS, NAA: SARS-CoV-2, NAA: NOT DETECTED

## 2019-04-12 LAB — SARS CORONAVIRUS 2 (TAT 6-24 HRS): SARS Coronavirus 2: NEGATIVE

## 2019-04-12 MED ORDER — CEFAZOLIN SODIUM-DEXTROSE 2-4 GM/100ML-% IV SOLN
2.0000 g | INTRAVENOUS | Status: AC
  Administered 2019-04-13: 13:00:00 2 g via INTRAVENOUS
  Filled 2019-04-12: qty 100

## 2019-04-13 ENCOUNTER — Other Ambulatory Visit: Payer: Self-pay

## 2019-04-13 ENCOUNTER — Ambulatory Visit (HOSPITAL_COMMUNITY): Payer: 59 | Admitting: Certified Registered Nurse Anesthetist

## 2019-04-13 ENCOUNTER — Encounter (HOSPITAL_COMMUNITY)
Admission: RE | Disposition: A | Discharge status: Home / Self Care | Payer: Self-pay | Source: Home / Self Care | Attending: Orthopedic Surgery

## 2019-04-13 ENCOUNTER — Encounter (HOSPITAL_COMMUNITY): Payer: Self-pay | Admitting: *Deleted

## 2019-04-13 ENCOUNTER — Ambulatory Visit (HOSPITAL_COMMUNITY)
Admission: RE | Admit: 2019-04-13 | Discharge: 2019-04-13 | Disposition: A | Discharge status: Home / Self Care | Payer: 59 | Attending: Orthopedic Surgery | Admitting: Orthopedic Surgery

## 2019-04-13 ENCOUNTER — Ambulatory Visit (HOSPITAL_COMMUNITY): Payer: 59

## 2019-04-13 DIAGNOSIS — F17210 Nicotine dependence, cigarettes, uncomplicated: Secondary | ICD-10-CM | POA: Insufficient documentation

## 2019-04-13 DIAGNOSIS — M1990 Unspecified osteoarthritis, unspecified site: Secondary | ICD-10-CM | POA: Insufficient documentation

## 2019-04-13 DIAGNOSIS — Z791 Long term (current) use of non-steroidal anti-inflammatories (NSAID): Secondary | ICD-10-CM | POA: Insufficient documentation

## 2019-04-13 DIAGNOSIS — Z419 Encounter for procedure for purposes other than remedying health state, unspecified: Secondary | ICD-10-CM

## 2019-04-13 DIAGNOSIS — M5116 Intervertebral disc disorders with radiculopathy, lumbar region: Secondary | ICD-10-CM | POA: Diagnosis not present

## 2019-04-13 DIAGNOSIS — Z01812 Encounter for preprocedural laboratory examination: Secondary | ICD-10-CM | POA: Insufficient documentation

## 2019-04-13 DIAGNOSIS — Z20828 Contact with and (suspected) exposure to other viral communicable diseases: Secondary | ICD-10-CM | POA: Insufficient documentation

## 2019-04-13 HISTORY — PX: LUMBAR LAMINECTOMY/DECOMPRESSION MICRODISCECTOMY: SHX5026

## 2019-04-13 SURGERY — LUMBAR LAMINECTOMY/DECOMPRESSION MICRODISCECTOMY
Anesthesia: General | Site: Spine Lumbar

## 2019-04-13 MED ORDER — POVIDONE-IODINE 7.5 % EX SOLN
Freq: Once | CUTANEOUS | Status: DC
  Filled 2019-04-13 (×2): qty 118

## 2019-04-13 MED ORDER — DEXMEDETOMIDINE HCL 200 MCG/2ML IV SOLN
INTRAVENOUS | Status: DC | PRN
  Administered 2019-04-13: 12 ug via INTRAVENOUS
  Administered 2019-04-13: 20 ug via INTRAVENOUS

## 2019-04-13 MED ORDER — OXYCODONE HCL 5 MG PO TABS
5.0000 mg | ORAL_TABLET | Freq: Once | ORAL | Status: AC | PRN
  Administered 2019-04-13: 5 mg via ORAL

## 2019-04-13 MED ORDER — LIDOCAINE 2% (20 MG/ML) 5 ML SYRINGE
INTRAMUSCULAR | Status: AC
  Filled 2019-04-13: qty 5

## 2019-04-13 MED ORDER — HYDROMORPHONE HCL 1 MG/ML IJ SOLN: 0.2500 mg | INTRAMUSCULAR | Status: DC | PRN

## 2019-04-13 MED ORDER — MIDAZOLAM HCL 2 MG/2ML IJ SOLN
INTRAMUSCULAR | Status: AC
  Filled 2019-04-13: qty 2

## 2019-04-13 MED ORDER — BUPIVACAINE-EPINEPHRINE 0.25% -1:200000 IJ SOLN
INTRAMUSCULAR | Status: DC | PRN
  Administered 2019-04-13: 20 mL

## 2019-04-13 MED ORDER — DEXAMETHASONE SODIUM PHOSPHATE 10 MG/ML IJ SOLN
INTRAMUSCULAR | Status: DC | PRN
  Administered 2019-04-13: 10 mg via INTRAVENOUS

## 2019-04-13 MED ORDER — METHYLENE BLUE 0.5 % INJ SOLN
INTRAVENOUS | Status: AC
  Filled 2019-04-13: qty 10

## 2019-04-13 MED ORDER — OXYCODONE HCL 5 MG/5ML PO SOLN: 5.0000 mg | Freq: Once | ORAL | Status: AC | PRN

## 2019-04-13 MED ORDER — LIDOCAINE 2% (20 MG/ML) 5 ML SYRINGE
INTRAMUSCULAR | Status: DC | PRN
  Administered 2019-04-13: 100 mg via INTRAVENOUS

## 2019-04-13 MED ORDER — DIAZEPAM 5 MG PO TABS: 5.0000 mg | ORAL_TABLET | Freq: Four times a day (QID) | ORAL | Status: DC | PRN

## 2019-04-13 MED ORDER — ROCURONIUM BROMIDE 10 MG/ML (PF) SYRINGE
PREFILLED_SYRINGE | INTRAVENOUS | Status: DC | PRN
  Administered 2019-04-13: 100 mg via INTRAVENOUS

## 2019-04-13 MED ORDER — ONDANSETRON HCL 4 MG/2ML IJ SOLN: 4.0000 mg | Freq: Once | INTRAMUSCULAR | Status: DC | PRN

## 2019-04-13 MED ORDER — PROPOFOL 10 MG/ML IV BOLUS
INTRAVENOUS | Status: AC
  Filled 2019-04-13: qty 20

## 2019-04-13 MED ORDER — LACTATED RINGERS IV SOLN
INTRAVENOUS | Status: DC
  Administered 2019-04-13 (×2): via INTRAVENOUS

## 2019-04-13 MED ORDER — SODIUM CHLORIDE 0.9 % IV SOLN
INTRAVENOUS | Status: DC | PRN
  Administered 2019-04-13: 25 ug/min via INTRAVENOUS

## 2019-04-13 MED ORDER — SUGAMMADEX SODIUM 200 MG/2ML IV SOLN
INTRAVENOUS | Status: DC | PRN
  Administered 2019-04-13: 400 mg via INTRAVENOUS

## 2019-04-13 MED ORDER — PROPOFOL 10 MG/ML IV BOLUS
INTRAVENOUS | Status: DC | PRN
  Administered 2019-04-13: 200 mg via INTRAVENOUS

## 2019-04-13 MED ORDER — FENTANYL CITRATE (PF) 250 MCG/5ML IJ SOLN
INTRAMUSCULAR | Status: DC | PRN
  Administered 2019-04-13: 50 ug via INTRAVENOUS
  Administered 2019-04-13: 100 ug via INTRAVENOUS
  Administered 2019-04-13: 50 ug via INTRAVENOUS

## 2019-04-13 MED ORDER — PHENYLEPHRINE 40 MCG/ML (10ML) SYRINGE FOR IV PUSH (FOR BLOOD PRESSURE SUPPORT)
PREFILLED_SYRINGE | INTRAVENOUS | Status: DC | PRN
  Administered 2019-04-13: 80 ug via INTRAVENOUS
  Administered 2019-04-13: 120 ug via INTRAVENOUS
  Administered 2019-04-13: 80 ug via INTRAVENOUS
  Administered 2019-04-13: 160 ug via INTRAVENOUS
  Administered 2019-04-13 (×3): 80 ug via INTRAVENOUS

## 2019-04-13 MED ORDER — 0.9 % SODIUM CHLORIDE (POUR BTL) OPTIME
TOPICAL | Status: DC | PRN
  Administered 2019-04-13 (×2): 1000 mL

## 2019-04-13 MED ORDER — OXYCODONE HCL 5 MG PO TABS
ORAL_TABLET | ORAL | Status: AC
  Filled 2019-04-13: qty 1

## 2019-04-13 MED ORDER — BUPIVACAINE LIPOSOME 1.3 % IJ SUSP
20.0000 mL | INTRAMUSCULAR | Status: AC
  Administered 2019-04-13: 20 mL
  Filled 2019-04-13: qty 20

## 2019-04-13 MED ORDER — BUPIVACAINE-EPINEPHRINE (PF) 0.25% -1:200000 IJ SOLN
INTRAMUSCULAR | Status: AC
  Filled 2019-04-13: qty 30

## 2019-04-13 MED ORDER — ONDANSETRON HCL 4 MG/2ML IJ SOLN
INTRAMUSCULAR | Status: DC | PRN
  Administered 2019-04-13: 4 mg via INTRAVENOUS

## 2019-04-13 MED ORDER — FENTANYL CITRATE (PF) 250 MCG/5ML IJ SOLN
INTRAMUSCULAR | Status: AC
  Filled 2019-04-13: qty 5

## 2019-04-13 MED ORDER — OXYCODONE-ACETAMINOPHEN 5-325 MG PO TABS: 1.0000 | ORAL_TABLET | ORAL | 0 refills | Status: DC | PRN

## 2019-04-13 MED ORDER — DIAZEPAM 5 MG PO TABS: 5.0000 mg | ORAL_TABLET | Freq: Four times a day (QID) | ORAL | 0 refills | Status: DC | PRN

## 2019-04-13 MED ORDER — HEMOSTATIC AGENTS (NO CHARGE) OPTIME
TOPICAL | Status: DC | PRN
  Administered 2019-04-13: 1 via TOPICAL

## 2019-04-13 MED ORDER — MIDAZOLAM HCL 5 MG/5ML IJ SOLN
INTRAMUSCULAR | Status: DC | PRN
  Administered 2019-04-13: 2 mg via INTRAVENOUS

## 2019-04-13 MED ORDER — ONDANSETRON HCL 4 MG/2ML IJ SOLN
INTRAMUSCULAR | Status: AC
  Filled 2019-04-13: qty 2

## 2019-04-13 MED ORDER — METHYLPREDNISOLONE ACETATE 40 MG/ML IJ SUSP
INTRAMUSCULAR | Status: AC
  Filled 2019-04-13: qty 1

## 2019-04-13 MED ORDER — THROMBIN 20000 UNITS EX SOLR
CUTANEOUS | Status: DC | PRN
  Administered 2019-04-13: 20 mL via TOPICAL

## 2019-04-13 MED ORDER — SUCCINYLCHOLINE CHLORIDE 200 MG/10ML IV SOSY
PREFILLED_SYRINGE | INTRAVENOUS | Status: AC
  Filled 2019-04-13: qty 10

## 2019-04-13 MED ORDER — OXYCODONE-ACETAMINOPHEN 5-325 MG PO TABS: 1.0000 | ORAL_TABLET | ORAL | Status: DC | PRN

## 2019-04-13 MED ORDER — THROMBIN (RECOMBINANT) 20000 UNITS EX SOLR
CUTANEOUS | Status: AC
  Filled 2019-04-13: qty 20000

## 2019-04-13 MED ORDER — METHYLPREDNISOLONE ACETATE 40 MG/ML IJ SUSP
INTRAMUSCULAR | Status: DC | PRN
  Administered 2019-04-13: 40 mg

## 2019-04-13 SURGICAL SUPPLY — 80 items
AGENT HMST KT MTR STRL THRMB (HEMOSTASIS)
APL SKNCLS STERI-STRIP NONHPOA (GAUZE/BANDAGES/DRESSINGS) ×1
BENZOIN TINCTURE PRP APPL 2/3 (GAUZE/BANDAGES/DRESSINGS) ×2 IMPLANT
BUR PRECISION FLUTE 5.0 (BURR) ×3 IMPLANT
BUR SABER RD CUTTING 3.0 (BURR) ×1 IMPLANT
BUR SABER RD CUTTING 3.0MM (BURR) ×1
CABLE BIPOLOR RESECTION CORD (MISCELLANEOUS) ×3 IMPLANT
CANISTER SUCT 3000ML PPV (MISCELLANEOUS) ×3 IMPLANT
CARTRIDGE OIL MAESTRO DRILL (MISCELLANEOUS) ×1 IMPLANT
CLOSURE STERI-STRIP 1/2X4 (GAUZE/BANDAGES/DRESSINGS) ×1
CLOSURE WOUND 1/2 X4 (GAUZE/BANDAGES/DRESSINGS)
CLSR STERI-STRIP ANTIMIC 1/2X4 (GAUZE/BANDAGES/DRESSINGS) ×1 IMPLANT
COVER SURGICAL LIGHT HANDLE (MISCELLANEOUS) ×3 IMPLANT
COVER WAND RF STERILE (DRAPES) ×3 IMPLANT
DIFFUSER DRILL AIR PNEUMATIC (MISCELLANEOUS) ×3 IMPLANT
DRAIN CHANNEL 15F RND FF W/TCR (WOUND CARE) IMPLANT
DRAPE POUCH INSTRU U-SHP 10X18 (DRAPES) ×6 IMPLANT
DRAPE SURG 17X23 STRL (DRAPES) ×12 IMPLANT
DURAPREP 26ML APPLICATOR (WOUND CARE) ×3 IMPLANT
ELECT BLADE 4.0 EZ CLEAN MEGAD (MISCELLANEOUS) ×3
ELECT CAUTERY BLADE 6.4 (BLADE) ×3 IMPLANT
ELECT REM PT RETURN 9FT ADLT (ELECTROSURGICAL) ×3
ELECTRODE BLDE 4.0 EZ CLN MEGD (MISCELLANEOUS) ×1 IMPLANT
ELECTRODE REM PT RTRN 9FT ADLT (ELECTROSURGICAL) ×1 IMPLANT
EVACUATOR SILICONE 100CC (DRAIN) IMPLANT
FILTER STRAW FLUID ASPIR (MISCELLANEOUS) ×3 IMPLANT
GAUZE 4X4 16PLY RFD (DISPOSABLE) ×6 IMPLANT
GAUZE SPONGE 4X4 12PLY STRL (GAUZE/BANDAGES/DRESSINGS) ×3 IMPLANT
GLOVE BIO SURGEON STRL SZ7 (GLOVE) ×3 IMPLANT
GLOVE BIO SURGEON STRL SZ8 (GLOVE) ×3 IMPLANT
GLOVE BIOGEL PI IND STRL 7.0 (GLOVE) ×1 IMPLANT
GLOVE BIOGEL PI IND STRL 8 (GLOVE) ×1 IMPLANT
GLOVE BIOGEL PI INDICATOR 7.0 (GLOVE) ×2
GLOVE BIOGEL PI INDICATOR 8 (GLOVE) ×2
GOWN STRL REUS W/ TWL LRG LVL3 (GOWN DISPOSABLE) ×1 IMPLANT
GOWN STRL REUS W/ TWL XL LVL3 (GOWN DISPOSABLE) ×2 IMPLANT
GOWN STRL REUS W/TWL LRG LVL3 (GOWN DISPOSABLE) ×3
GOWN STRL REUS W/TWL XL LVL3 (GOWN DISPOSABLE) ×6
IV CATH 14GX2 1/4 (CATHETERS) ×3 IMPLANT
KIT BASIN OR (CUSTOM PROCEDURE TRAY) ×3 IMPLANT
KIT POSITION SURG JACKSON T1 (MISCELLANEOUS) ×3 IMPLANT
KIT TURNOVER KIT B (KITS) ×3 IMPLANT
MATRIX HEMOSTAT SURGIFLO (HEMOSTASIS) ×2 IMPLANT
NDL 18GX1X1/2 (RX/OR ONLY) (NEEDLE) ×1 IMPLANT
NDL HYPO 25GX1X1/2 BEV (NEEDLE) ×1 IMPLANT
NDL SPNL 18GX3.5 QUINCKE PK (NEEDLE) ×2 IMPLANT
NEEDLE 18GX1X1/2 (RX/OR ONLY) (NEEDLE) ×3 IMPLANT
NEEDLE 22X1 1/2 (OR ONLY) (NEEDLE) ×3 IMPLANT
NEEDLE HYPO 25GX1X1/2 BEV (NEEDLE) ×3 IMPLANT
NEEDLE SPNL 18GX3.5 QUINCKE PK (NEEDLE) ×6 IMPLANT
NS IRRIG 1000ML POUR BTL (IV SOLUTION) ×3 IMPLANT
OIL CARTRIDGE MAESTRO DRILL (MISCELLANEOUS) ×3
PACK LAMINECTOMY ORTHO (CUSTOM PROCEDURE TRAY) ×3 IMPLANT
PACK UNIVERSAL I (CUSTOM PROCEDURE TRAY) ×3 IMPLANT
PAD ARMBOARD 7.5X6 YLW CONV (MISCELLANEOUS) ×6 IMPLANT
PATTIES SURGICAL .5 X.5 (GAUZE/BANDAGES/DRESSINGS) IMPLANT
PATTIES SURGICAL .5 X1 (DISPOSABLE) ×3 IMPLANT
SPONGE INTESTINAL PEANUT (DISPOSABLE) ×3 IMPLANT
SPONGE SURGIFOAM ABS GEL 100 (HEMOSTASIS) ×3 IMPLANT
SPONGE SURGIFOAM ABS GEL SZ50 (HEMOSTASIS) ×3 IMPLANT
STRIP CLOSURE SKIN 1/2X4 (GAUZE/BANDAGES/DRESSINGS) IMPLANT
SURGIFLO W/THROMBIN 8M KIT (HEMOSTASIS) IMPLANT
SUT MNCRL AB 4-0 PS2 18 (SUTURE) ×3 IMPLANT
SUT VIC AB 0 CT1 18XCR BRD 8 (SUTURE) IMPLANT
SUT VIC AB 0 CT1 27 (SUTURE)
SUT VIC AB 0 CT1 27XBRD ANBCTR (SUTURE) IMPLANT
SUT VIC AB 0 CT1 8-18 (SUTURE)
SUT VIC AB 1 CT1 18XCR BRD 8 (SUTURE) ×1 IMPLANT
SUT VIC AB 1 CT1 8-18 (SUTURE) ×3
SUT VIC AB 2-0 CT2 18 VCP726D (SUTURE) ×3 IMPLANT
SYR 20ML LL LF (SYRINGE) ×3 IMPLANT
SYR BULB IRRIGATION 50ML (SYRINGE) ×3 IMPLANT
SYR CONTROL 10ML LL (SYRINGE) ×6 IMPLANT
SYR TB 1ML 25GX5/8 (SYRINGE) ×6 IMPLANT
SYR TB 1ML LUER SLIP (SYRINGE) ×6 IMPLANT
TAPE CLOTH SURG 4X10 WHT LF (GAUZE/BANDAGES/DRESSINGS) ×2 IMPLANT
TOWEL GREEN STERILE (TOWEL DISPOSABLE) ×3 IMPLANT
TOWEL GREEN STERILE FF (TOWEL DISPOSABLE) ×3 IMPLANT
WATER STERILE IRR 1000ML POUR (IV SOLUTION) ×3 IMPLANT
YANKAUER SUCT BULB TIP NO VENT (SUCTIONS) ×3 IMPLANT

## 2019-04-13 NOTE — Anesthesia Preprocedure Evaluation (Addendum)
Anesthesia Evaluation  Patient identified by MRN, date of birth, ID band Patient awake    Reviewed: Allergy & Precautions, NPO status , Patient's Chart, lab work & pertinent test results  History of Anesthesia Complications Negative for: history of anesthetic complications  Airway Mallampati: III  TM Distance: >3 FB Neck ROM: Full    Dental  (+) Teeth Intact   Pulmonary neg pulmonary ROS, Current Smoker,    Pulmonary exam normal        Cardiovascular negative cardio ROS Normal cardiovascular exam     Neuro/Psych negative neurological ROS  negative psych ROS   GI/Hepatic negative GI ROS, Neg liver ROS,   Endo/Other  negative endocrine ROS  Renal/GU negative Renal ROS  negative genitourinary   Musculoskeletal  (+) Arthritis ,   Abdominal   Peds  Hematology negative hematology ROS (+)   Anesthesia Other Findings   Reproductive/Obstetrics                            Anesthesia Physical Anesthesia Plan  ASA: II  Anesthesia Plan: General   Post-op Pain Management:    Induction: Intravenous  PONV Risk Score and Plan: 2 and Ondansetron, Dexamethasone, Midazolam and Treatment may vary due to age or medical condition  Airway Management Planned: Oral ETT  Additional Equipment: None  Intra-op Plan:   Post-operative Plan: Extubation in OR  Informed Consent: I have reviewed the patients History and Physical, chart, labs and discussed the procedure including the risks, benefits and alternatives for the proposed anesthesia with the patient or authorized representative who has indicated his/her understanding and acceptance.     Dental advisory given  Plan Discussed with:   Anesthesia Plan Comments:        Anesthesia Quick Evaluation

## 2019-04-13 NOTE — H&P (Signed)
     PREOPERATIVE H&P  Chief Complaint: Left leg pain  HPI: Richard Turner is a 38 y.o. male who presents with ongoing pain in the left leg  MRI reveals a large left L4/5 disc hernation  Patient has failed multiple forms of conservative care and continues to have pain (see office notes for additional details regarding the patient's full course of treatment)  Past Medical History:  Diagnosis Date  . Arthritis    No past surgical history on file. Social History   Socioeconomic History  . Marital status: Married    Spouse name: Not on file  . Number of children: Not on file  . Years of education: Not on file  . Highest education level: Not on file  Occupational History  . Not on file  Social Needs  . Financial resource strain: Not on file  . Food insecurity    Worry: Not on file    Inability: Not on file  . Transportation needs    Medical: Not on file    Non-medical: Not on file  Tobacco Use  . Smoking status: Current Every Day Smoker    Packs/day: 0.50    Types: Cigarettes  . Smokeless tobacco: Never Used  Substance and Sexual Activity  . Alcohol use: No  . Drug use: No  . Sexual activity: Not on file  Lifestyle  . Physical activity    Days per week: Not on file    Minutes per session: Not on file  . Stress: Not on file  Relationships  . Social Herbalist on phone: Not on file    Gets together: Not on file    Attends religious service: Not on file    Active member of club or organization: Not on file    Attends meetings of clubs or organizations: Not on file    Relationship status: Not on file  Other Topics Concern  . Not on file  Social History Narrative  . Not on file   No family history on file. No Known Allergies Prior to Admission medications   Medication Sig Start Date End Date Taking? Authorizing Provider  diclofenac (VOLTAREN) 75 MG EC tablet Take 75 mg by mouth 2 (two) times daily as needed for mild pain.  03/09/19  Yes  [provider]  oxyCODONE-acetaminophen (PERCOCET/ROXICET) 5-325 MG tablet Take 1 tablet by mouth every 4 (four) hours as needed for severe pain.   Yes [provider]     All other systems have been reviewed and were otherwise negative with the exception of those mentioned in the HPI and as above.  Physical Exam: There were no vitals filed for this visit.  There is no height or weight on file to calculate BMI.  General: Alert, no acute distress Cardiovascular: No pedal edema Respiratory: No cyanosis, no use of accessory musculature Skin: No lesions in the area of chief complaint Neurologic: Sensation intact distally Psychiatric: Patient is competent for consent with normal mood and affect Lymphatic: No axillary or cervical lymphadenopathy  MUSCULOSKELETAL: + SLR on the left  Assessment/Plan: LEFT LUMBAR 4 RADICULOPATHY SECONDARY TO A LARGE LEFT LUMBAR 4-5 [place] for Procedure(s): LUMBAR 4-5 DECOMPRESSION   Norva Karvonen, MD 04/13/2019 6:44 AM

## 2019-04-13 NOTE — Op Note (Signed)
PATIENT NAME: Richard Turner [facility]   MEDICAL RECORD NO.:  [id number]   PHYSICIAN:  Phylliss Bob, MD      DATE OF BIRTH: 10-Apr-1981   DATE OF PROCEDURE: 04/13/2019                                 OPERATIVE REPORT     PREOPERATIVE DIAGNOSES: 1. Left-sided L4 radiculopathy. 2. 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   POSTOPERATIVE DIAGNOSES: 1. Left-sided L4 radiculopathy. 2. 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   PROCEDURES:  Left-sided L4/5 laminotomy with partial facetectomy and removal of large herniated foraminal left-sided L4/5 disk fragment.   SURGEON:  Phylliss Bob, MD.   ASSISTANT:  None   ANESTHESIA:  General endotracheal anesthesia.   COMPLICATIONS:  None.   DISPOSITION:  Stable.   ESTIMATED BLOOD LOSS:  Minimal.   INDICATIONS FOR SURGERY:  Briefly, [name] is a pleasant 38 year old male, who did present to me with severe pain in the left Leg. The patient's MRI did reveal the findings outlined above, clearly notable for a large foraminal herniated disk fragment. The pain was rather severe and he did have weakness in his leg as well.  We did discuss treatment options and we did ultimately elect to proceed with the procedure reflected above.  The patient was fully made aware of the risks of surgery, including the risk of recurrent herniation and the need for subsequent surgery, including the possibility of a subsequent diskectomy and/or fusion.   OPERATIVE DETAILS:  On 04/13/2019, the patient was brought to surgery and general endotracheal anesthesia was administered.  The patient was placed prone on a well-padded flat Jackson bed with a spinal frame.  Antibiotics were given.  The back was prepped and draped and a time-out procedure was performed.  At this point, a left-sided paramedian incision was made directly over the L4-5 intervertebral  space.  An incision was made into the fascia, and the Wiltsie plane was identified.  A self-retaining Thompson retractor was placed over the lateral aspect of the L4 lamina.  Using a high-speed bur and Kerrison punches, I did remove the lateral aspect of the L4-5 facet joint and the lateral aspect of the L4 pars interarticularis.  Ligamentum flavum was identified, and the lateral aspect of it was removed.  The exiting L4 nerve was readily noted and was noted to be very inflamed and erythematous.  Using a [facility], I did superiorly and laterally retract the L4 nerve, and readily noted was a large extruded disc fragment located behind the L4 vertebral body and just above the intervertebral space.  With gentle safe superior and lateral retraction of the nerve, I did remove 3 extruded fragments.  Upon additional exploration of the foraminal extraforaminal region, I was able to confirm that there was no additional compression of the nerve. I was very pleased with the final decompression that I was able to accomplish.  At this point, the wound was copiously irrigated with normal saline.  All epidural bleeding was controlled using bipolar electrocautery in addition to Surgiflo. All bleeding was controlled at the termination of the procedure.  At this point, 40 mg of Depo-Medrol was introduced about the epidural space in the region of the left L4 nerve.  The wound was then closed in layers  using #1 Vicryl followed by 0 Vicryl, followed by 4-0 Monocryl. Benzoin and Steri-Strips were applied followed by a sterile dressing. All instrument counts were correct at the termination of the procedure.     Estill BambergMark , MD

## 2019-04-13 NOTE — Anesthesia Postprocedure Evaluation (Signed)
Anesthesia Post Note  Patient: Richard Turner  Procedure(s) Performed: LUMBAR 4-5 DECOMPRESSION (N/A Spine Lumbar)     Patient location during evaluation: PACU Anesthesia Type: General Level of consciousness: awake and alert Pain management: pain level controlled Vital Signs Assessment: post-procedure vital signs reviewed and stable Respiratory status: spontaneous breathing, nonlabored ventilation and respiratory function stable Cardiovascular status: blood pressure returned to baseline and stable Postop Assessment: no apparent nausea or vomiting Anesthetic complications: no    Last Vitals:  Vitals:   04/13/19 1550 04/13/19 1551  BP:  (!) 147/99  Pulse:    Resp: 18   Temp: (!) 36.2 C   SpO2: 100%     Last Pain:  Vitals:   04/13/19 1550  TempSrc:   PainSc: 2                  Lidia Collum

## 2019-04-13 NOTE — Transfer of Care (Signed)
Immediate Anesthesia Transfer of Care Note  Patient: Richard Turner  Procedure(s) Performed: LUMBAR 4-5 DECOMPRESSION (N/A Spine Lumbar)  Patient Location: PACU  Anesthesia Type:General  Level of Consciousness: drowsy  Airway & Oxygen Therapy: Patient Spontanous Breathing and Patient connected to nasal cannula oxygen  Post-op Assessment: Report given to RN, Post -op Vital signs reviewed and stable and Patient moving all extremities  Post vital signs: Reviewed and stable  Last Vitals:  Vitals Value Taken Time  BP 126/79 04/13/19 1520  Temp 36.2 C 04/13/19 1520  Pulse 77 04/13/19 1529  Resp 18 04/13/19 1520  SpO2 100 % 04/13/19 1529  Vitals shown include unvalidated device data.  Last Pain:  Vitals:   04/13/19 1520  TempSrc:   PainSc: Asleep      Patients Stated Pain Goal: 4 (56/38/75 [id number])  Complications: No apparent anesthesia complications

## 2019-04-13 NOTE — Anesthesia Procedure Notes (Signed)
Procedure Name: Intubation Date/Time: 04/13/2019 12:32 PM Performed by: Imagene Riches, CRNA Pre-anesthesia Checklist: Patient identified, Emergency Drugs available, Suction available and Patient being monitored Patient Re-evaluated:Patient Re-evaluated prior to induction Oxygen Delivery Method: Circle System Utilized Preoxygenation: Pre-oxygenation with 100% oxygen Induction Type: IV induction Ventilation: Mask ventilation without difficulty Laryngoscope Size: Miller and 3 Grade View: Grade I Tube type: Oral Tube size: 7.5 mm Number of attempts: 1 Airway Equipment and Method: Stylet and Oral airway Placement Confirmation: ETT inserted through vocal cords under direct vision,  positive ETCO2 and breath sounds checked- equal and bilateral Secured at: 24 cm Tube secured with: Tape Dental Injury: Teeth and Oropharynx as per pre-operative assessment

## 2019-04-14 ENCOUNTER — Encounter (HOSPITAL_COMMUNITY): Payer: Self-pay | Admitting: Orthopedic Surgery

## 2019-04-14 MED FILL — Thrombin (Recombinant) For Soln 20000 Unit: CUTANEOUS | Qty: 1 | Status: AC

## 2020-10-31 IMAGING — CR LUMBAR SPINE - 1 VIEW
1 series · 1 of 1 positions shown · non-contrast
Comparison: 03/09/2019, 03/20/2019

CLINICAL DATA: Instrument localization LUMBAR 4-5 DECOMPRESSION

EXAM:
LUMBAR SPINE - 1 VIEW

[lateral]
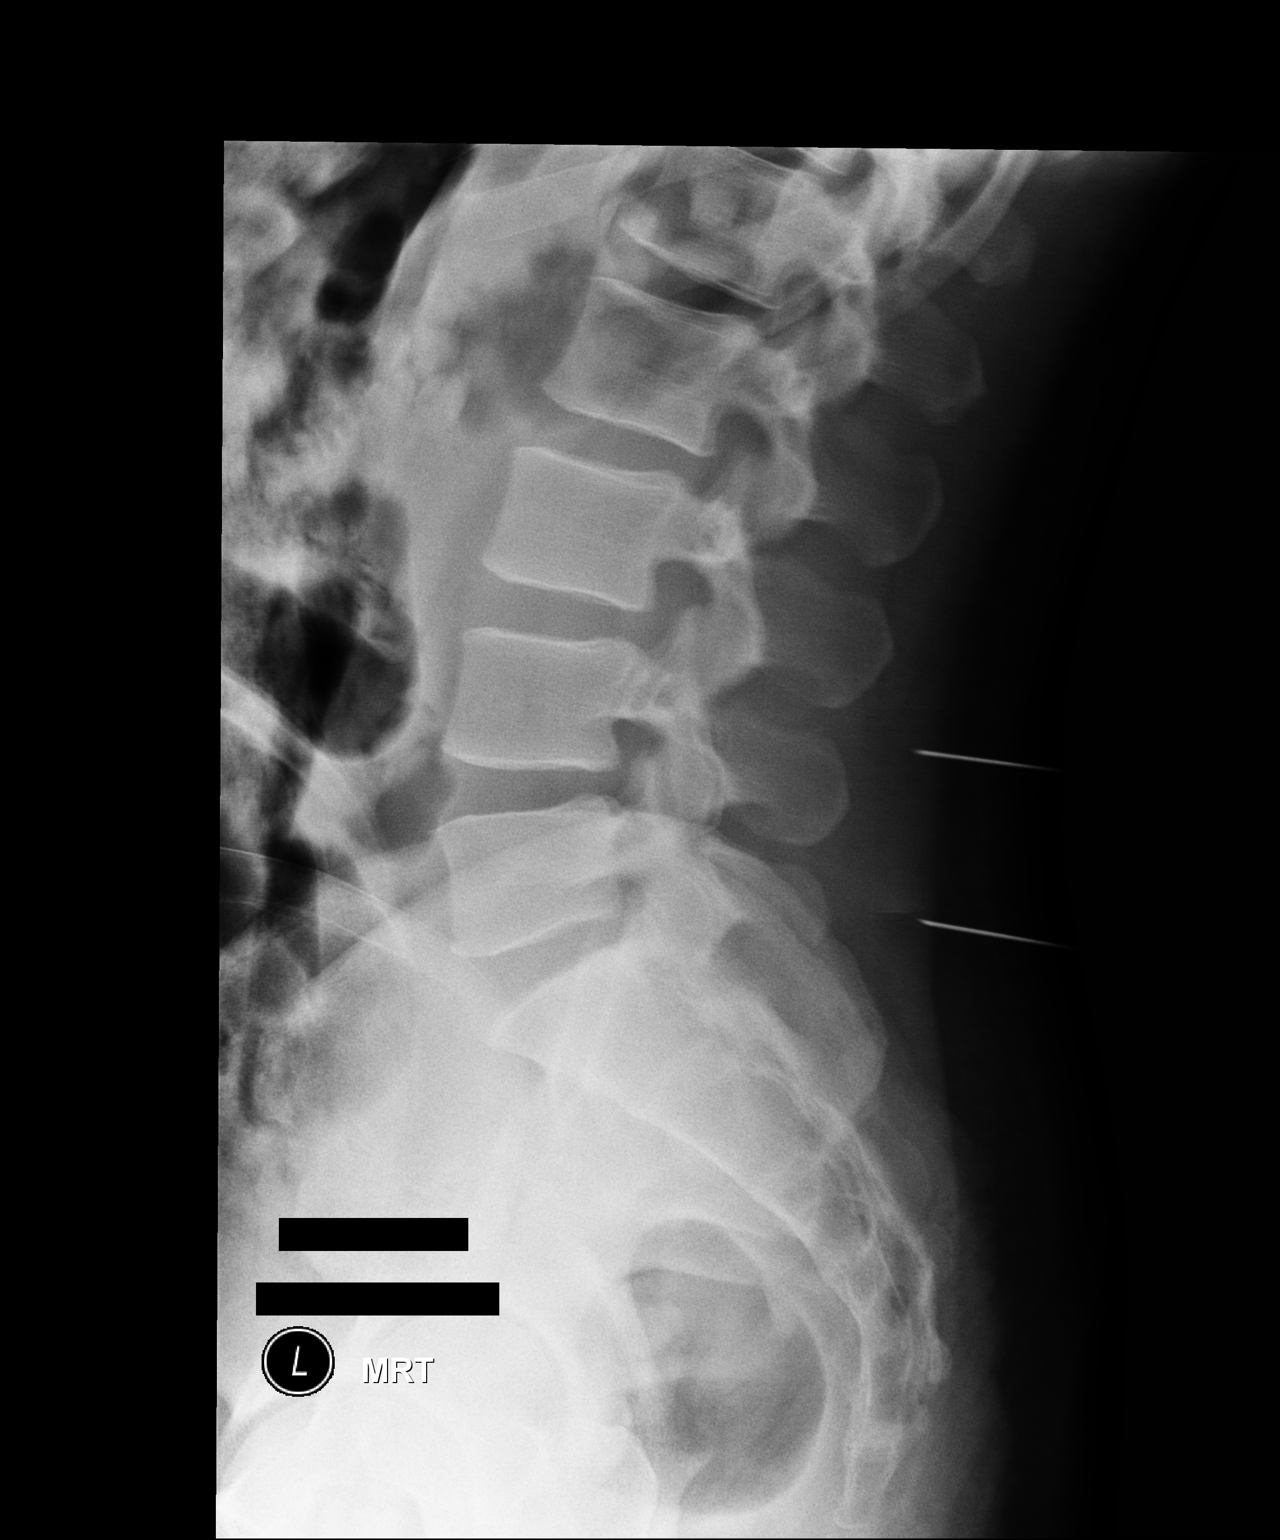

[1 of 1 positions shown; findings below may reference images not displayed]

FINDINGS: Single LATERAL view is performed. Instruments are identified along
the posterior aspect of the lumbar spine, immediately posterior to
the spinous process at L4 and L5.
IMPRESSION: Intraoperative localization.

## 2020-12-10 ENCOUNTER — Encounter (HOSPITAL_COMMUNITY): Payer: Self-pay

## 2020-12-10 ENCOUNTER — Other Ambulatory Visit: Payer: Self-pay

## 2020-12-10 ENCOUNTER — Emergency Department (HOSPITAL_COMMUNITY)
Admission: EM | Admit: 2020-12-10 | Discharge: 2020-12-10 | Disposition: A | Discharge status: Home / Self Care | Payer: 59 | Attending: Emergency Medicine | Admitting: Emergency Medicine

## 2020-12-10 ENCOUNTER — Emergency Department (HOSPITAL_COMMUNITY): Payer: 59

## 2020-12-10 DIAGNOSIS — F17210 Nicotine dependence, cigarettes, uncomplicated: Secondary | ICD-10-CM | POA: Diagnosis not present

## 2020-12-10 DIAGNOSIS — Z8616 Personal history of COVID-19: Secondary | ICD-10-CM | POA: Diagnosis not present

## 2020-12-10 DIAGNOSIS — Z20822 Contact with and (suspected) exposure to covid-19: Secondary | ICD-10-CM | POA: Insufficient documentation

## 2020-12-10 DIAGNOSIS — J069 Acute upper respiratory infection, unspecified: Secondary | ICD-10-CM | POA: Diagnosis not present

## 2020-12-10 DIAGNOSIS — R059 Cough, unspecified: Secondary | ICD-10-CM | POA: Diagnosis present

## 2020-12-10 LAB — RESP PANEL BY RT-PCR (FLU A&B, COVID) ARPGX2
Influenza A by PCR: NEGATIVE
Influenza B by PCR: NEGATIVE
SARS Coronavirus 2 by RT PCR: NEGATIVE

## 2020-12-10 MED ORDER — PROMETHAZINE-DM 6.25-15 MG/5ML PO SYRP: 5.0000 mL | ORAL_SOLUTION | Freq: Four times a day (QID) | ORAL | 0 refills | Status: DC | PRN

## 2020-12-10 MED ORDER — PROMETHAZINE-DM 6.25-15 MG/5ML PO SYRP: 5.0000 mL | ORAL_SOLUTION | Freq: Four times a day (QID) | ORAL | 0 refills | Status: AC | PRN

## 2020-12-10 NOTE — ED Provider Notes (Signed)
[facility]-EMERGENCY DEPT Provider Note   CSN: [id number] Arrival date & time: 12/10/20  1858     History Chief Complaint  Patient presents with  . Cough    Richard Turner is a 40 y.o. male.  40 year old male with a past medical history of childhood asthma is in the ED with a chief complaint of shortness of breath, nasal congestion, cough for the past 3 days.  Patient endorses a productive cough which had clear sputum initially which now has turned into a dry cough.  He also reports nasal congestion, does report coughing fits that make his chest hurt along with causing him to be short of breath.  He does report he gets winded with exertion and coughing.  He did take a COVID-19 test while at home which was negative.  He did have COVID-19 last year but states "this does not feel nearly as bad ".  He has tried taking DayQuil, TheraFlu, Zyrtec, Claritin without any improvement in his symptoms.  He also endorses cigarette smoking daily. He denies any prior hx of CAD, no fever, no other complaints.    The history is provided by the patient.       Past Medical History:  Diagnosis Date  . Arthritis     There are no problems to display for this patient.   Past Surgical History:  Procedure Laterality Date  . LUMBAR LAMINECTOMY/DECOMPRESSION MICRODISCECTOMY N/A 04/13/2019   Procedure: LUMBAR 4-5 DECOMPRESSION;  Surgeon: Estill Bamberg, MD;  Location: [facility] OR;  Service: Orthopedics;  Laterality: N/A;       History reviewed. No pertinent family history.  Social History   Tobacco Use  . Smoking status: Current Every Day Smoker    Packs/day: 0.50    Types: Cigarettes  . Smokeless tobacco: Never Used  Vaping Use  . Vaping Use: Never used  Substance Use Topics  . Alcohol use: No  . Drug use: No    Home Medications Prior to Admission medications   Medication Sig Start Date End Date Taking? Authorizing Provider  promethazine-dextromethorphan  (PROMETHAZINE-DM) 6.25-15 MG/5ML syrup Take 5 mLs by mouth 4 (four) times daily as needed for up to 7 days for cough. Please take 5 mL every 4 to 6 hours; MAXIMUN 30 mL in 24 hours 12/10/20 12/17/20 Yes , , PA-C  diazepam (VALIUM) 5 MG tablet Take 1 tablet (5 mg total) by mouth every 6 (six) hours as needed for muscle spasms. 04/13/19   Estill Bamberg, MD  diclofenac (VOLTAREN) 75 MG EC tablet Take 75 mg by mouth 2 (two) times daily as needed for mild pain.  03/09/19   [provider]  oxyCODONE-acetaminophen (PERCOCET/ROXICET) 5-325 MG tablet Take 1 tablet by mouth every 4 (four) hours as needed for severe pain.    [provider]  oxyCODONE-acetaminophen (PERCOCET/ROXICET) 5-325 MG tablet Take 1 tablet by mouth every 4 (four) hours as needed for severe pain. 04/13/19   Estill Bamberg, MD    Allergies    Patient has no known allergies.  Review of Systems   Review of Systems  Constitutional: Negative for fever.  HENT: Positive for postnasal drip and sinus pressure. Negative for sore throat.   Respiratory: Positive for cough, chest tightness and shortness of breath.   Gastrointestinal: Negative for abdominal pain, nausea and vomiting.  Genitourinary: Negative for flank pain.  Neurological: Negative for light-headedness and headaches.  All other systems reviewed and are negative.   Physical Exam Updated Vital Signs BP 139/75  Pulse 75   Temp 98.3 F (36.8 C) (Oral)   Resp 18   Ht 6\' 3"  (1.905 m)   Wt 117.9 kg   SpO2 97%   BMI 32.50 kg/m   Physical Exam Vitals and nursing note reviewed.  Constitutional:      Appearance: Normal appearance.  HENT:     Head: Normocephalic and atraumatic.     Nose: Congestion present.     Mouth/Throat:     Mouth: Mucous membranes are moist.     Pharynx: Posterior oropharyngeal erythema present. No oropharyngeal exudate.  Eyes:     Pupils: Pupils are equal, round, and reactive to light.  Cardiovascular:     Rate and  Rhythm: Normal rate.     Comments: No pitting edema or calf tenderness.  Pulmonary:     Effort: Pulmonary effort is normal.     Breath sounds: No wheezing, rhonchi or rales.     Comments: Lungs are clear to auscultation without any wheezing, rhonchi, rales. Abdominal:     General: Abdomen is flat.     Tenderness: There is no abdominal tenderness.  Musculoskeletal:     Cervical back: Normal range of motion and neck supple.     Right lower leg: No edema.     Left lower leg: No edema.  Skin:    General: Skin is warm and dry.  Neurological:     Mental Status: He is alert and oriented to person, place, and time.     ED Results / Procedures / Treatments   Labs (all labs ordered are listed, but only abnormal results are displayed) Labs Reviewed  RESP PANEL BY RT-PCR (FLU A&B, COVID) [id number]    EKG None  Radiology DG Chest 2 View  Result Date: 12/10/2020 CLINICAL DATA:  Cough and shortness of breath. Congestion and weakness. Fatigue. EXAM: CHEST - 2 VIEW COMPARISON:  07/15/2014 FINDINGS: The heart size and mediastinal contours are within normal limits. Both lungs are clear. The visualized skeletal structures are unremarkable. IMPRESSION: No active cardiopulmonary disease. Electronically Signed   By: 13/09/2013 M.D.   On: 12/10/2020 19:47    Procedures Procedures   Medications Ordered in ED Medications - No data to display  ED Course  I have reviewed the triage vital signs and the nursing notes.  Pertinent labs & imaging results that were available during my care of the patient were reviewed by me and considered in my medical decision making (see chart for details).    MDM Rules/Calculators/A&P  Patient presents to the ED with 3 days of cough, nasal congestion, shortness of breath, chest pain with coughing.  Also endorses.  Smoking daily, has not stop smoking.  He reports smoking a cigarette prior to arrival into the ED, has tried over-the-counter medication without  any improvement in symptoms.  Does report having COVID-19 last year, states "this does not feel as bad".   During evaluation patient is overall well-appearing, oxygen saturation is 97% on room air,No tachycardia or tachypnea.  He is afebrile during arrival.  Is are clear to auscultation without any wheezing, rhonchi, rales.  Does report a history of asthma when he was a child, has not been using any inhaler, no symptomatic treatment at home.  He reports he would like relief for his continuous cough.  We discussed the likely cough could be due to his tobacco use.  I encouraged smoking cessation.  We will obtain a Covid test while in the ED.  X-ray did not show any  pneumonia, we discussed no initiation of antibiotics on today's visit with normal vital signs.  No bilateral pitting edema, no calf tenderness.  PERC negative.  Symptomatic treatment with cough syrup, return precautions discussed at length.  Patient stable for discharge.  Portions of this note were generated with Scientist, clinical (histocompatibility and immunogenetics). Dictation errors may occur despite best attempts at proofreading.  Final Clinical Impression(s) / ED Diagnoses Final diagnoses:  Viral URI with cough    Rx / DC Orders ED Discharge Orders         Ordered    promethazine-dextromethorphan (PROMETHAZINE-DM) 6.25-15 MG/5ML syrup  4 times daily PRN        12/10/20 2057           Claude Manges, PA-C 12/10/20 2105    Rolan Bucco, MD 12/10/20 2144

## 2020-12-10 NOTE — Discharge Instructions (Addendum)
You were tested for COVID-19 today, you will be called if your results are positive. You may also check results via MyChart.  I have provided a prescription for cough syrup, please take this as prescribed up to 30 mL in 24 hours.  If you experience any fever, worsening symptoms you may return to the emergency department.

## 2020-12-10 NOTE — ED Triage Notes (Signed)
Pt presents from home, c/o cough, congestion and weakness/fatigue x 3 days. Also c/o intermittent SOB, hx asthma. States he took an at home covid test and it was negative

## 2021-01-07 ENCOUNTER — Other Ambulatory Visit: Payer: Self-pay

## 2021-01-07 ENCOUNTER — Emergency Department (HOSPITAL_BASED_OUTPATIENT_CLINIC_OR_DEPARTMENT_OTHER)
Admission: EM | Admit: 2021-01-07 | Discharge: 2021-01-08 | Disposition: A | Discharge status: Home / Self Care | Payer: 59 | Attending: Emergency Medicine | Admitting: Emergency Medicine

## 2021-01-07 DIAGNOSIS — J029 Acute pharyngitis, unspecified: Secondary | ICD-10-CM | POA: Diagnosis present

## 2021-01-07 DIAGNOSIS — F17210 Nicotine dependence, cigarettes, uncomplicated: Secondary | ICD-10-CM | POA: Diagnosis not present

## 2021-01-07 DIAGNOSIS — J069 Acute upper respiratory infection, unspecified: Secondary | ICD-10-CM | POA: Diagnosis not present

## 2021-01-07 NOTE — ED Triage Notes (Signed)
Patient c/o mid chest pain, cough and sore throat. Patient states this happened 2 weeks ago as well. Patient has recently returned from [place].

## 2021-01-08 ENCOUNTER — Encounter (HOSPITAL_BASED_OUTPATIENT_CLINIC_OR_DEPARTMENT_OTHER): Payer: Self-pay

## 2021-01-08 MED ORDER — HYDROCODONE-HOMATROPINE 5-1.5 MG/5ML PO SYRP: 5.0000 mL | ORAL_SOLUTION | Freq: Every evening | ORAL | 0 refills | Status: DC | PRN

## 2021-01-08 NOTE — ED Provider Notes (Signed)
[facility] EMERGENCY DEPT Provider Note   CSN: [id number] Arrival date & time: 01/07/21  2351     History Chief Complaint  Patient presents with  . Sore Throat  . Chest Pain    Richard Turner is a 40 y.o. male.  The history is provided by the patient.  Sore Throat This is a new problem. The current episode started 2 days ago. The problem occurs daily. The problem has been gradually worsening. Associated symptoms include chest pain. The symptoms are aggravated by swallowing. Nothing relieves the symptoms.  Chest Pain Associated symptoms: cough and fatigue   Associated symptoms: no fever and no vomiting   Patient presents for cough, sore throat for the past 2-3 days.  Patient reports he recently came back from vacation in [place].  Upon returning, he began having a mild sore throat no the next day began having increasing cough and fatigue.  He reports headache and chest pain with cough only.  He reports mild shortness of breath.  No hemoptysis.  He is a smoker.  No previous history of CAD/VTE     Past Medical History:  Diagnosis Date  . Arthritis     There are no problems to display for this patient.   Past Surgical History:  Procedure Laterality Date  . LUMBAR LAMINECTOMY/DECOMPRESSION MICRODISCECTOMY N/A 04/13/2019   Procedure: LUMBAR 4-5 DECOMPRESSION;  Surgeon: Estill Bamberg, MD;  Location: [facility] OR;  Service: Orthopedics;  Laterality: N/A;       History reviewed. No pertinent family history.  Social History   Tobacco Use  . Smoking status: Current Every Day Smoker    Packs/day: 0.50    Types: Cigarettes  . Smokeless tobacco: Never Used  Vaping Use  . Vaping Use: Never used  Substance Use Topics  . Alcohol use: No  . Drug use: No    Home Medications Prior to Admission medications   Medication Sig Start Date End Date Taking? Authorizing Provider  HYDROcodone-homatropine (HYCODAN) 5-1.5 MG/5ML syrup Take 5 mLs by mouth at bedtime as  needed for cough. 01/08/21  Yes Zadie Rhine, MD    Allergies    Patient has no known allergies.  Review of Systems   Review of Systems  Constitutional: Positive for chills and fatigue. Negative for fever.  Respiratory: Positive for cough.        Denies hemoptysis  Cardiovascular: Positive for chest pain. Negative for leg swelling.  Gastrointestinal: Negative for vomiting.  All other systems reviewed and are negative.   Physical Exam Updated Vital Signs BP 130/88 (BP Location: Right Arm)   Pulse 89   Temp 98 F (36.7 C) (Oral)   Ht 1.905 m (6\' 3" )   Wt 125.2 kg   SpO2 97%   BMI 34.50 kg/m  Respiratory rate =14 Physical Exam CONSTITUTIONAL: Well developed/well nourished no acute distress talking on the phone HEAD: Normocephalic/atraumatic EYES: EOMI/PERRL ENMT: Mucous membranes moist, uvula midline without erythema or exudates, no stridor or drooling NECK: supple no meningeal signs SPINE/BACK:entire spine nontender CV: S1/S2 noted, no murmurs/rubs/gallops noted LUNGS: Lungs are clear to auscultation bilaterally, no apparent distress ABDOMEN: soft, nontender, no rebound or guarding, bowel sounds noted throughout abdomen GU:no cva tenderness NEURO: Pt is awake/alert/appropriate, moves all extremitiesx4.  No facial droop.   EXTREMITIES: pulses normal/equal, full ROM, no calf tenderness or edema SKIN: warm, color normal PSYCH: no abnormalities of mood noted, alert and oriented to situation  ED Results / Procedures / Treatments   Labs (all labs ordered  are listed, but only abnormal results are displayed) Labs Reviewed - No data to display  EKG EKG Interpretation  Date/Time:  Wednesday January 08 2021 00:03:57 EDT Ventricular Rate:  88 PR Interval:  156 QRS Duration: 99 QT Interval:  341 QTC Calculation: 413 R Axis:   44 Text Interpretation: Sinus rhythm Anteroseptal infarct, old Confirmed by Zadie Rhine ([id number]) on 01/08/2021 12:08:03 AM   Radiology No  results found.  Procedures Procedures   Medications Ordered in ED Medications - No data to display  ED Course  I have reviewed the triage vital signs and the nursing notes.    MDM Rules/Calculators/A&P                          Patient reports he is mostly concerned that he is unable to sleep due to coughing at night.  He is in no acute distress, no hypoxia.  Lung sounds are clear.  Suspect viral URI.  He reports last month he had similar episode that resolved spontaneously.  He declines COVID-19 testing.  Will provide Hycodan cough medicine. We discussed return cautions Final Clinical Impression(s) / ED Diagnoses Final diagnoses:  Viral URI with cough    Rx / DC Orders ED Discharge Orders         Ordered    HYDROcodone-homatropine (HYCODAN) 5-1.5 MG/5ML syrup  At bedtime PRN        01/08/21 0018           Zadie Rhine, MD 01/08/21 0022

## 2022-06-30 IMAGING — CR DG CHEST 2V
2 series · 2 of 2 positions shown · non-contrast
Comparison: 07/15/2014

CLINICAL DATA: Cough and shortness of breath. Congestion and
weakness. Fatigue.

EXAM:
CHEST - 2 VIEW

[w chest pa]
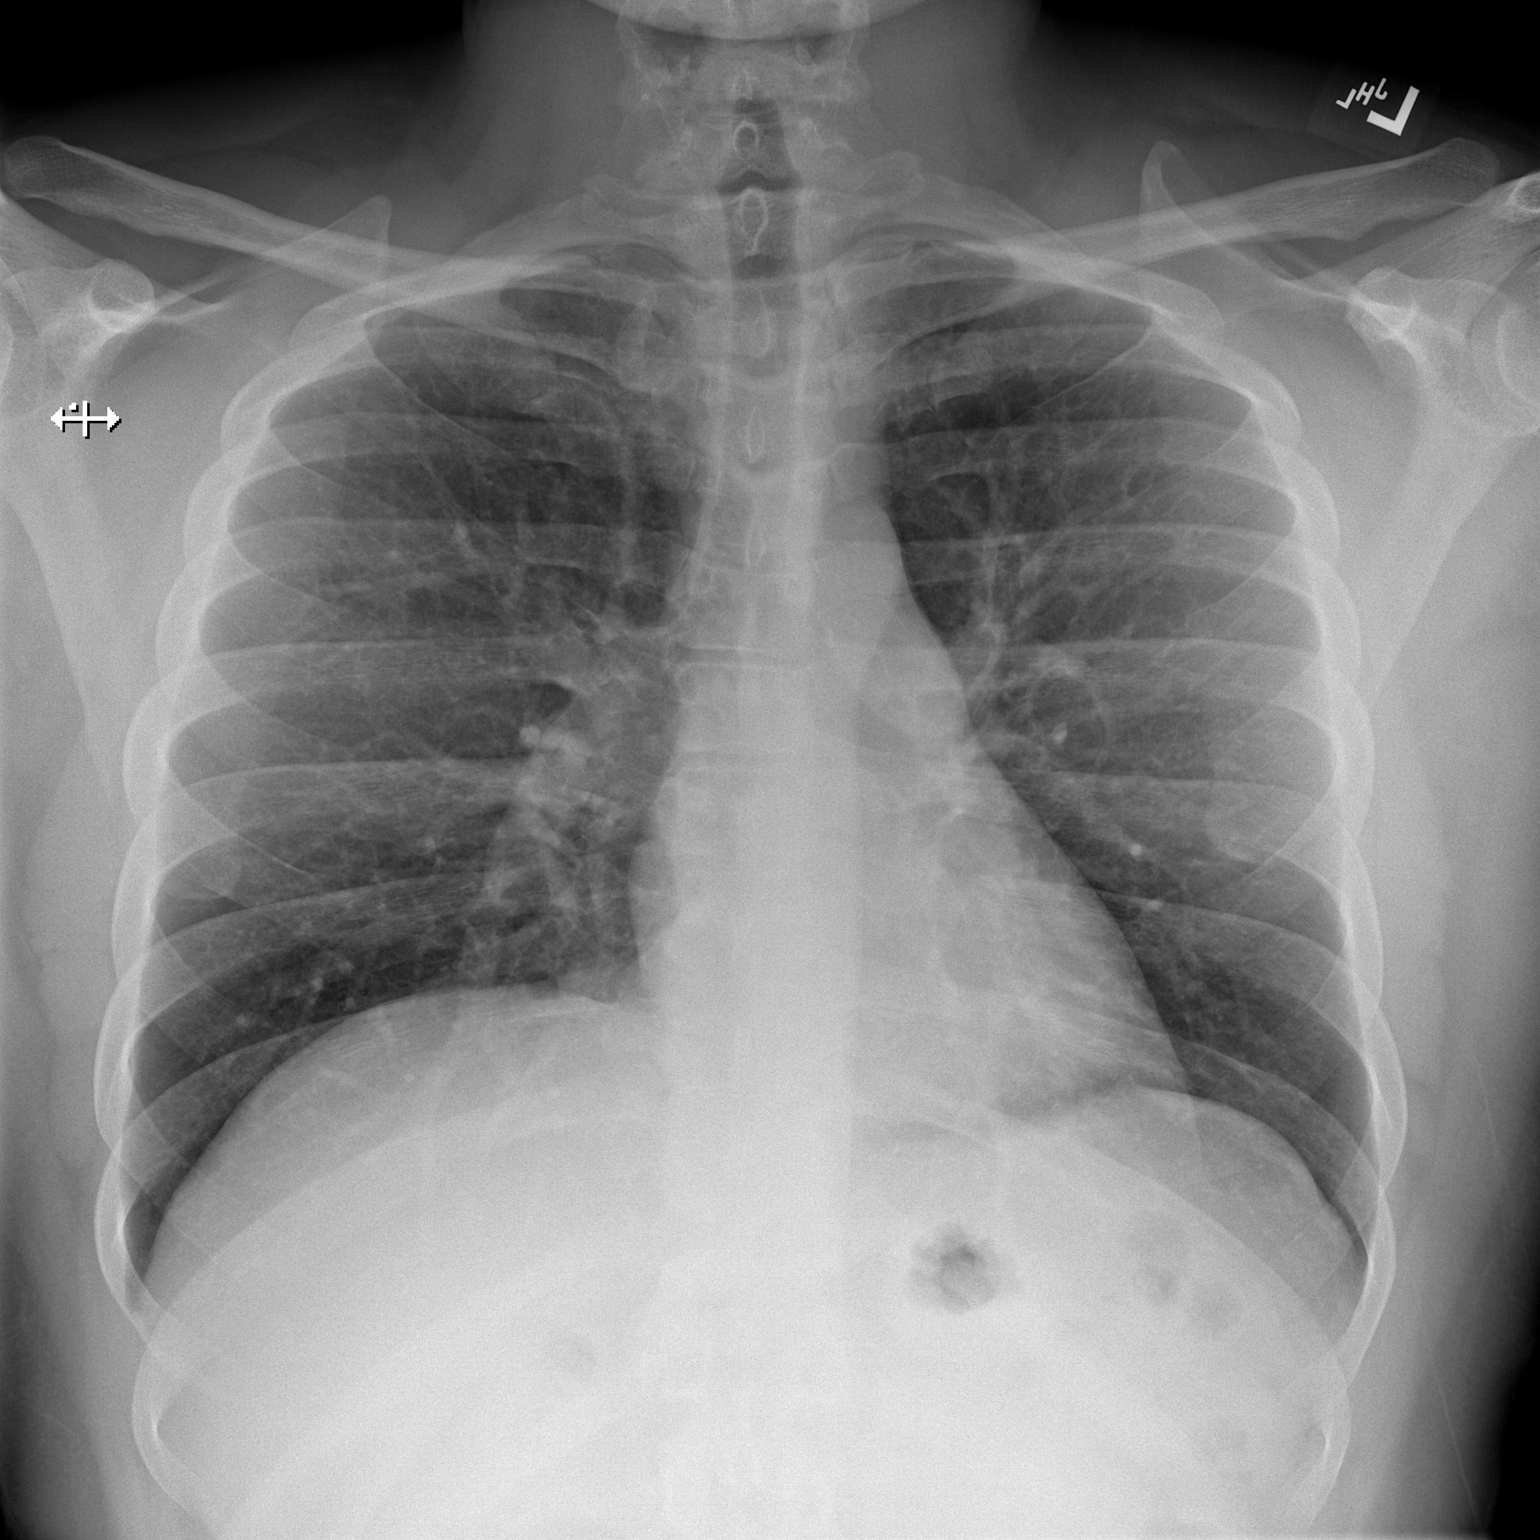

[w chest lat]
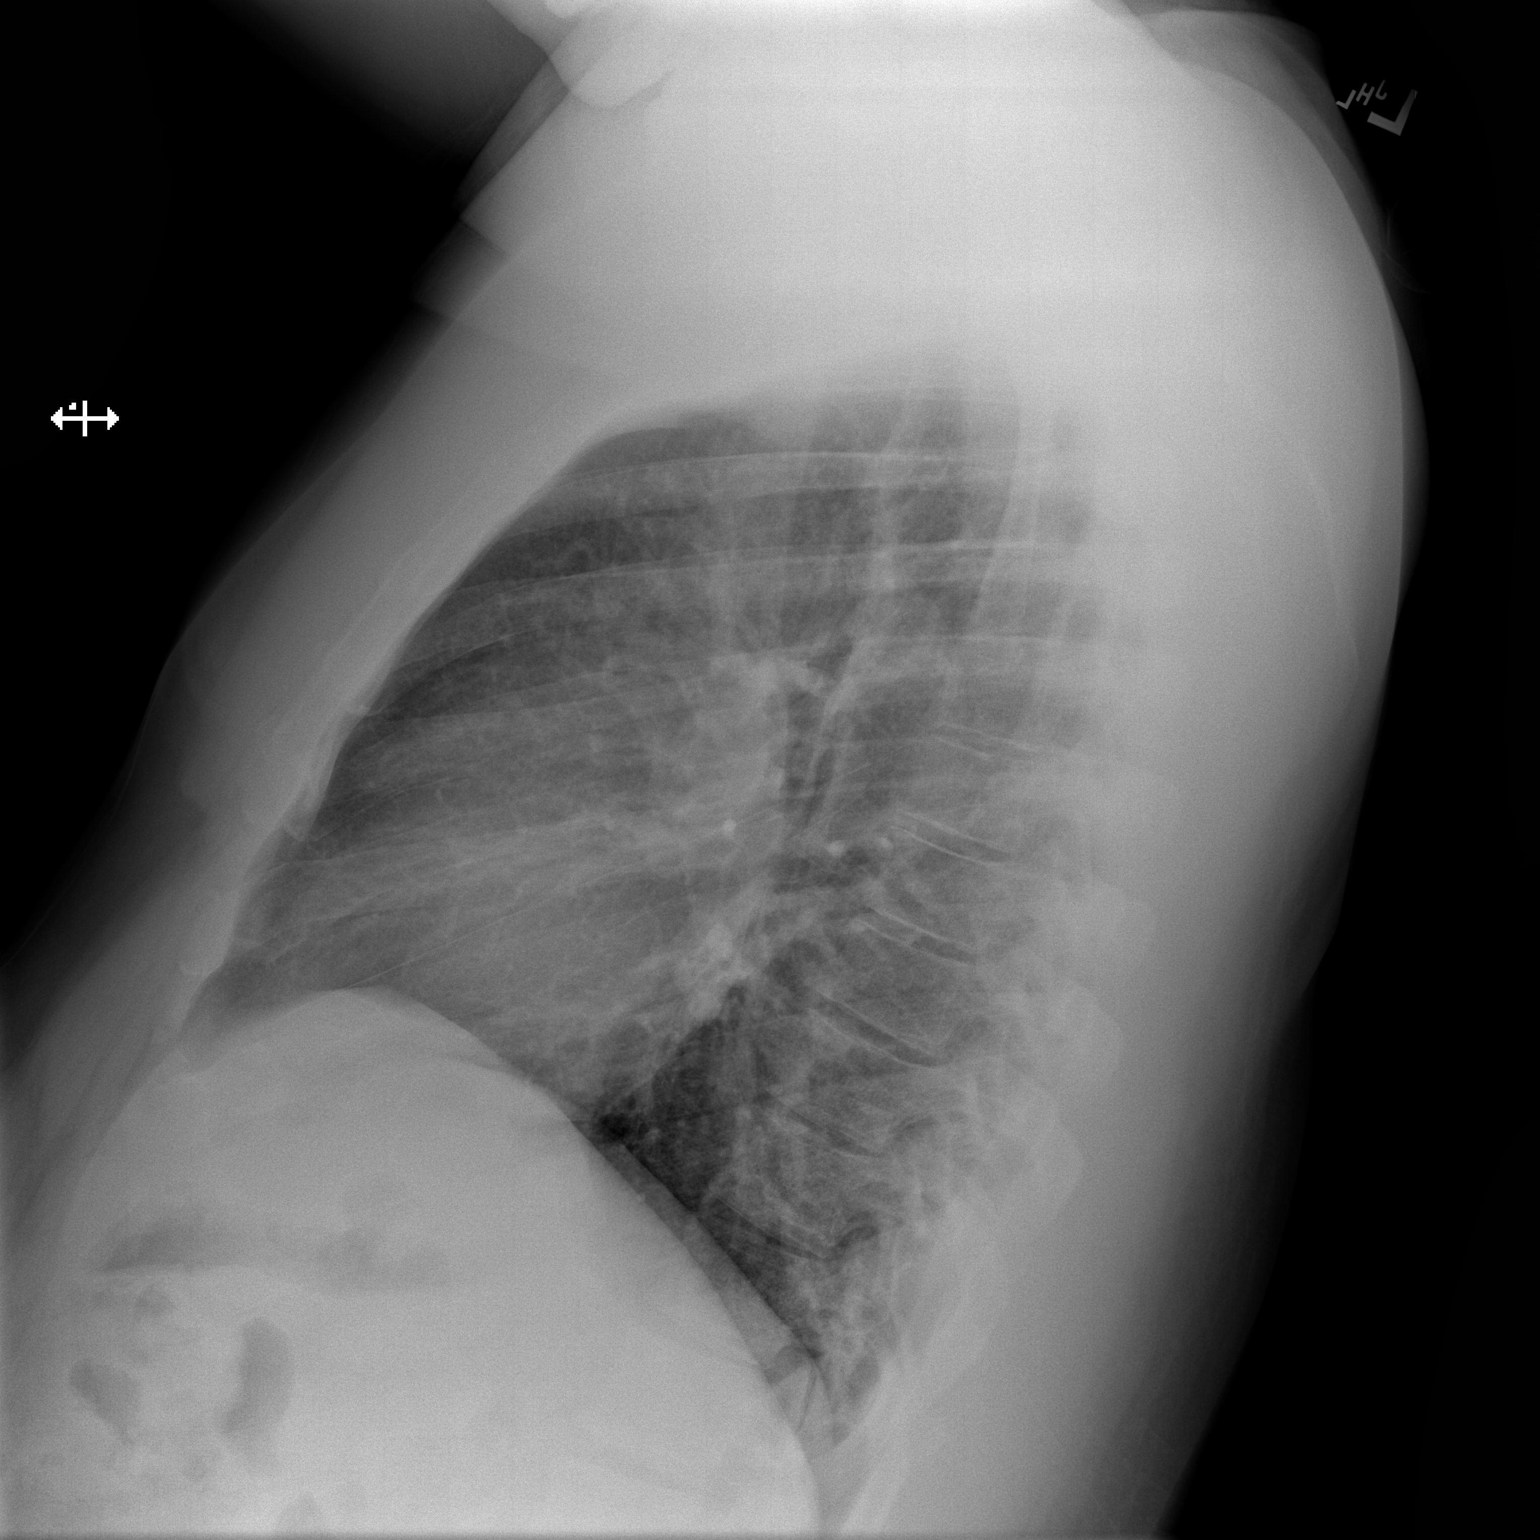

[2 of 2 positions shown; findings below may reference images not displayed]

FINDINGS: The heart size and mediastinal contours are within normal limits.
Both lungs are clear. The visualized skeletal structures are
unremarkable.
IMPRESSION: No active cardiopulmonary disease.

## 2022-12-16 ENCOUNTER — Other Ambulatory Visit: Payer: Self-pay

## 2022-12-16 ENCOUNTER — Emergency Department (HOSPITAL_BASED_OUTPATIENT_CLINIC_OR_DEPARTMENT_OTHER): Payer: BC Managed Care – PPO

## 2022-12-16 ENCOUNTER — Emergency Department (HOSPITAL_BASED_OUTPATIENT_CLINIC_OR_DEPARTMENT_OTHER)
Admission: EM | Admit: 2022-12-16 | Discharge: 2022-12-16 | Disposition: A | Discharge status: Home / Self Care | Payer: BC Managed Care – PPO | Attending: Emergency Medicine | Admitting: Emergency Medicine

## 2022-12-16 DIAGNOSIS — R1310 Dysphagia, unspecified: Secondary | ICD-10-CM | POA: Insufficient documentation

## 2022-12-16 DIAGNOSIS — M542 Cervicalgia: Secondary | ICD-10-CM | POA: Diagnosis present

## 2022-12-16 LAB — BASIC METABOLIC PANEL
Anion gap: 9 (ref 5–15)
BUN: 16 mg/dL (ref 6–20)
CO2: 26 mmol/L (ref 22–32)
Calcium: 9.5 mg/dL (ref 8.9–10.3)
Chloride: 105 mmol/L (ref 98–111)
Creatinine, Ser: 1.25 mg/dL — ABNORMAL HIGH (ref 0.61–1.24)
GFR, Estimated: >60 mL/min (ref >60)
Glucose, Bld: 92 mg/dL (ref 70–99)
Potassium: 4.1 mmol/L (ref 3.5–5.1)
Sodium: 140 mmol/L (ref 135–145)

## 2022-12-16 MED ORDER — IOHEXOL 300 MG/ML  SOLN
100.0000 mL | Freq: Once | INTRAMUSCULAR | Status: AC | PRN
  Administered 2022-12-16: 75 mL via INTRAVENOUS

## 2022-12-16 MED ORDER — DEXAMETHASONE 4 MG PO TABS
8.0000 mg | ORAL_TABLET | Freq: Once | ORAL | Status: AC
  Administered 2022-12-16: 8 mg via ORAL
  Filled 2022-12-16: qty 2
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
## 2023-02-02 ENCOUNTER — Other Ambulatory Visit: Payer: Self-pay | Admitting: Internal Medicine

## 2023-02-03 LAB — CBC
HCT: 51.4 % — ABNORMAL HIGH (ref 38.5–50.0)
Hemoglobin: 16.8 g/dL (ref 13.2–17.1)
MCH: 27 pg (ref 27.0–33.0)
MCHC: 32.7 g/dL (ref 32.0–36.0)
MCV: 82.5 fL (ref 80.0–100.0)
MPV: 12.3 fL (ref 7.5–12.5)
Platelets: 162 10*3/uL (ref 140–400)
RBC: 6.23 10*6/uL — ABNORMAL HIGH (ref 4.20–5.80)
RDW: 13.6 % (ref 11.0–15.0)
WBC: 5.9 10*3/uL (ref 3.8–10.8)

## 2023-02-03 LAB — COMPLETE METABOLIC PANEL WITH GFR
AG Ratio: 1.8 (calc) (ref 1.0–2.5)
ALT: 21 U/L (ref 9–46)
AST: 17 U/L (ref 10–40)
Albumin: 4.6 g/dL (ref 3.6–5.1)
Alkaline phosphatase (APISO): 50 U/L (ref 36–130)
BUN/Creatinine Ratio: 8 (calc) (ref 6–22)
BUN: 11 mg/dL (ref 7–25)
CO2: 18 mmol/L — ABNORMAL LOW (ref 20–32)
Calcium: 9.7 mg/dL (ref 8.6–10.3)
Chloride: 109 mmol/L (ref 98–110)
Creat: 1.33 mg/dL — ABNORMAL HIGH (ref 0.60–1.29)
Globulin: 2.5 g/dL (calc) (ref 1.9–3.7)
Glucose, Bld: 96 mg/dL (ref 65–99)
Potassium: 4.6 mmol/L (ref 3.5–5.3)
Sodium: 143 mmol/L (ref 135–146)
Total Bilirubin: 0.5 mg/dL (ref 0.2–1.2)
Total Protein: 7.1 g/dL (ref 6.1–8.1)
eGFR: 68 mL/min/{1.73_m2} (ref >=60)

## 2023-02-03 LAB — LIPID PANEL
Cholesterol: 197 mg/dL (ref <200)
HDL: 66 mg/dL (ref >=40)
LDL Cholesterol (Calc): 107 mg/dL (calc) — ABNORMAL HIGH
Non-HDL Cholesterol (Calc): 131 mg/dL (calc) — ABNORMAL HIGH (ref <130)
Total CHOL/HDL Ratio: 3 (calc) (ref <5.0)
Triglycerides: 126 mg/dL (ref <150)

## 2023-02-03 LAB — VITAMIN D 25 HYDROXY (VIT D DEFICIENCY, FRACTURES): Vit D, 25-Hydroxy: 14 ng/mL — ABNORMAL LOW (ref 30–100)

## 2023-02-03 LAB — PSA: PSA: 0.5 ng/mL (ref <=4.00)

## 2023-03-24 ENCOUNTER — Other Ambulatory Visit: Payer: Self-pay | Admitting: Urology

## 2023-04-10 ENCOUNTER — Emergency Department (HOSPITAL_BASED_OUTPATIENT_CLINIC_OR_DEPARTMENT_OTHER): Payer: BC Managed Care – PPO

## 2023-04-10 ENCOUNTER — Emergency Department (HOSPITAL_BASED_OUTPATIENT_CLINIC_OR_DEPARTMENT_OTHER)
Admission: EM | Admit: 2023-04-10 | Discharge: 2023-04-10 | Disposition: A | Discharge status: Skilled Nursing Facility | Payer: BC Managed Care – PPO | Attending: Emergency Medicine | Admitting: Emergency Medicine

## 2023-04-10 ENCOUNTER — Encounter (HOSPITAL_BASED_OUTPATIENT_CLINIC_OR_DEPARTMENT_OTHER): Payer: Self-pay

## 2023-04-10 ENCOUNTER — Other Ambulatory Visit: Payer: Self-pay

## 2023-04-10 DIAGNOSIS — N3001 Acute cystitis with hematuria: Secondary | ICD-10-CM | POA: Insufficient documentation

## 2023-04-10 DIAGNOSIS — R197 Diarrhea, unspecified: Secondary | ICD-10-CM | POA: Diagnosis not present

## 2023-04-10 DIAGNOSIS — R319 Hematuria, unspecified: Secondary | ICD-10-CM | POA: Diagnosis present

## 2023-04-10 LAB — CBC WITH DIFFERENTIAL/PLATELET
Abs Immature Granulocytes: 0.03 10*3/uL (ref 0.00–0.07)
Basophils Absolute: 0.1 10*3/uL (ref 0.0–0.1)
Basophils Relative: 1 %
Eosinophils Absolute: 0.1 10*3/uL (ref 0.0–0.5)
Eosinophils Relative: 1 %
HCT: 48.8 % (ref 39.0–52.0)
Hemoglobin: 15.7 g/dL (ref 13.0–17.0)
Immature Granulocytes: 0 %
Lymphocytes Relative: 20 %
Lymphs Abs: 2.1 10*3/uL (ref 0.7–4.0)
MCH: 26.8 pg (ref 26.0–34.0)
MCHC: 32.2 g/dL (ref 30.0–36.0)
MCV: 83.3 fL (ref 80.0–100.0)
Monocytes Absolute: 1 10*3/uL (ref 0.1–1.0)
Monocytes Relative: 10 %
Neutro Abs: 7.1 10*3/uL (ref 1.7–7.7)
Neutrophils Relative %: 68 %
Platelets: 130 10*3/uL — ABNORMAL LOW (ref 150–400)
RBC: 5.86 MIL/uL — ABNORMAL HIGH (ref 4.22–5.81)
RDW: 13.7 % (ref 11.5–15.5)
WBC: 10.5 10*3/uL (ref 4.0–10.5)
nRBC: 0 % (ref 0.0–0.2)

## 2023-04-10 LAB — URINALYSIS, ROUTINE W REFLEX MICROSCOPIC
Bilirubin Urine: NEGATIVE
Glucose, UA: NEGATIVE mg/dL
Nitrite: NEGATIVE
Protein, ur: 30 mg/dL — AB
RBC / HPF: >50 RBC/hpf (ref 0–5)
Specific Gravity, Urine: 1.029 (ref 1.005–1.030)
WBC, UA: >50 WBC/hpf (ref 0–5)
pH: 6 (ref 5.0–8.0)

## 2023-04-10 LAB — BASIC METABOLIC PANEL
Anion gap: 8 (ref 5–15)
BUN: 12 mg/dL (ref 6–20)
CO2: 26 mmol/L (ref 22–32)
Calcium: 9.2 mg/dL (ref 8.9–10.3)
Chloride: 106 mmol/L (ref 98–111)
Creatinine, Ser: 1.17 mg/dL (ref 0.61–1.24)
GFR, Estimated: >60 mL/min (ref >60)
Glucose, Bld: 97 mg/dL (ref 70–99)
Potassium: 3.7 mmol/L (ref 3.5–5.1)
Sodium: 140 mmol/L (ref 135–145)

## 2023-04-10 MED ORDER — CEPHALEXIN 500 MG PO CAPS: 500.0000 mg | ORAL_CAPSULE | Freq: Three times a day (TID) | ORAL | 0 refills | Status: DC

## 2023-04-10 MED ORDER — CEPHALEXIN 250 MG PO CAPS
500.0000 mg | ORAL_CAPSULE | Freq: Once | ORAL | Status: AC
  Administered 2023-04-10: 500 mg via ORAL
  Filled 2023-04-10: qty 2

## 2023-04-10 NOTE — ED Triage Notes (Addendum)
POV from home, A&O x 4, GCS 15, amb to triage  C/o diarrhea x weeks, nausea since last night, blood in urine, dizziness at work/fatigue. Lower back pain

## 2023-04-10 NOTE — ED Notes (Signed)
All appropriate discharge materials reviewed at length with patient. Time for questions provided. Pt has no other questions at this time and verbalizes understanding of all provided materials.  

## 2023-04-10 NOTE — ED Provider Notes (Signed)
[name] Provider Note   CSN: [id number] Arrival date & time: 04/10/23  0114     History  Chief Complaint  Patient presents with   Hematuria   Diarrhea    Richard Turner is a 42 y.o. male.  Patient is a 42 year old male with no significant past medical history.  He presents with complaints of burning with urination and hematuria.  This started this evening when he went to use the bathroom.  He denies any injury or trauma, but does state that he had sex with a new partner last night.  He reports wearing a condom however.  No fevers or chills.  He does describe some pain in his lower back.  No alleviating factors.  The history is provided by the patient.       Home Medications Prior to Admission medications   Medication Sig Start Date End Date Taking? Authorizing Provider  HYDROcodone-homatropine (HYCODAN) 5-1.5 MG/5ML syrup Take 5 mLs by mouth at bedtime as needed for cough. 01/08/21   Zadie Rhine, MD      Allergies    Patient has no known allergies.    Review of Systems   Review of Systems  All other systems reviewed and are negative.   Physical Exam Updated Vital Signs BP 122/89   Pulse (!) 104   Temp 97.6 F (36.4 C) (Oral)   Resp 18   Ht 6\' 3"  (1.905 m)   Wt 122.5 kg   SpO2 96%   BMI 33.75 kg/m  Physical Exam Vitals and nursing note reviewed.  Constitutional:      General: He is not in acute distress.    Appearance: He is well-developed. He is not diaphoretic.  HENT:     Head: Normocephalic and atraumatic.  Cardiovascular:     Rate and Rhythm: Normal rate and regular rhythm.     Heart sounds: No murmur heard.    No friction rub.  Pulmonary:     Effort: Pulmonary effort is normal. No respiratory distress.     Breath sounds: Normal breath sounds. No wheezing or rales.  Abdominal:     General: Bowel sounds are normal. There is no distension.     Palpations: Abdomen is soft.     Tenderness:  There is no abdominal tenderness.  Musculoskeletal:        General: Normal range of motion.     Cervical back: Normal range of motion and neck supple.  Skin:    General: Skin is warm and dry.  Neurological:     Mental Status: He is alert and oriented to person, place, and time.     Coordination: Coordination normal.     ED Results / Procedures / Treatments   Labs (all labs ordered are listed, but only abnormal results are displayed) Labs Reviewed  URINALYSIS, ROUTINE W REFLEX MICROSCOPIC - Abnormal; Notable for the following components:      Result Value   APPearance HAZY (*)    Hgb urine dipstick LARGE (*)    Ketones, ur TRACE (*)    Protein, ur 30 (*)    Leukocytes,Ua LARGE (*)    Bacteria, UA RARE (*)    All other components within normal limits  CBC WITH DIFFERENTIAL/PLATELET - Abnormal; Notable for the following components:   RBC 5.86 (*)    Platelets 130 (*)    All other components within normal limits  BASIC METABOLIC PANEL    EKG None  Radiology CT Renal Stone  Study  Result Date: 04/10/2023 CLINICAL DATA:  Flank pain. Stone suspected. Blood in the urine. Also complains of diarrhea and back pain. EXAM: CT ABDOMEN AND PELVIS WITHOUT CONTRAST TECHNIQUE: Multidetector CT imaging of the abdomen and pelvis was performed following the standard protocol without IV contrast. RADIATION DOSE REDUCTION: This exam was performed according to the departmental dose-optimization program which includes automated exposure control, adjustment of the mA and/or kV according to patient size and/or use of iterative reconstruction technique. COMPARISON:  None Available. FINDINGS: Lower chest: No abnormality. Hepatobiliary: There is a 2 cm cyst in the right lobe of the liver in segment 8, Hounsfield density of 0.2. In segment 4A there is a 1 cm cyst with Hounsfield density of 3.7. There are additional scattered tiny hypodensities in the liver which are too small to characterize. No other focal  abnormality of liver seen. The gallbladder bile ducts are unremarkable. Pancreas: No abnormality is seen without contrast. Spleen: No abnormality is seen without contrast. Adrenals/Urinary Tract: There is no adrenal mass. No hydronephrosis, ureteral or intravesical stones are seen. There is a solitary 2 mm nonobstructing caliceal stone in the superior pole of the right kidney. In the upper pole right kidney there is a 1.5 cm cyst laterally, Hounsfield density is 18, and also a 2.3 cm cyst medially, Hounsfield density is 8.4. In the right lower pole, there is a 2.2 cm cyst, Hounsfield density is 6.4. These are consistent with benign renal cysts. No follow-up imaging is recommended. There is no contour deforming abnormality in the remainder of both kidneys. The bladder is contracted and not well seen but could be thickened. There do appear to be at least mild perivesical stranding changes. Correlate with urinalysis for possible cystitis. Stomach/Bowel: No dilatation or wall thickening including the appendix. Scattered uncomplicated diverticulosis. Vascular/Lymphatic: No significant vascular findings are present. No enlarged abdominal or pelvic lymph nodes. Reproductive: Prostate is unremarkable. Other: Small umbilical and inguinal fat hernias. No free hemorrhage, free air or free fluid. No focal inflammatory process. Musculoskeletal: Anterior bridging osteophytes right SI joint. No acute or other significant osseous findings. IMPRESSION: 1. No hydronephrosis, ureteral or intravesical stones. 2. 2 mm solitary nonobstructing caliceal stone superior pole of the right kidney. 3. Benign-appearing right renal cysts. 4. The bladder is contracted and not well seen but could be thickened. There do appear to be at least mild perivesical stranding changes. Correlate with urinalysis for possible cystitis. 5. Hepatic cysts and additional tiny hypodensities which are too small to characterize. No follow-up imaging is required in a  low risk patient. In a high-risk patient, 6-12 month MRI without and with contrast would be indicated. 6. Uncomplicated diverticulosis. 7. Small umbilical and inguinal fat hernias. Electronically Signed   By: Almira Bar M.D.   On: 04/10/2023 02:16    Procedures Procedures    Medications Ordered in ED Medications  cephALEXin (KEFLEX) capsule 500 mg (has no administration in time range)    ED Course/ Medical Decision Making/ A&P  Patient presenting with burning with urination and hematuria as described in the HPI.  He arrives here with stable vital signs and is clinically well-appearing.  Workup initiated including CBC, metabolic panel, both of which are unremarkable.  Urinalysis is consistent with UTI.  CT scan with renal protocol obtained showing a contracted bladder with wall thickening consistent with cystitis.  Patient to be treated with Keflex and is to follow-up with primary doctor.  Final Clinical Impression(s) / ED Diagnoses Final diagnoses:  None  Rx / DC Orders ED Discharge Orders     None         Geoffery Lyons, MD 04/10/23 [phone]

## 2023-04-10 NOTE — Discharge Instructions (Addendum)
Begin taking Keflex as prescribed.  Follow-up with primary doctor if not improving in the next few days.  We will call you if your culture indicates you require further treatment or need to take additional action.

## 2023-04-12 LAB — GC/CHLAMYDIA PROBE AMP (~~LOC~~) NOT AT ARMC
Chlamydia: NEGATIVE
Comment: NEGATIVE
Comment: NORMAL
Neisseria Gonorrhea: NEGATIVE

## 2023-04-20 ENCOUNTER — Encounter (HOSPITAL_BASED_OUTPATIENT_CLINIC_OR_DEPARTMENT_OTHER): Payer: Self-pay | Admitting: Urology

## 2023-04-20 NOTE — Progress Notes (Signed)
Spoke w/ via phone for pre-op interview--- Vernell Leep pronounced Surge Lab needs dos----    NONE           Lab results------ COVID test -----patient states asymptomatic no test needed Arrive at -------0930 NPO after MN NO Solid Food.  Clear liquids from MN until---0830 Med rec completed Medications to take morning of surgery -----NONE Diabetic medication ----- Patient instructed no nail polish to be worn day of surgery Patient instructed to bring photo id and insurance card day of surgery Patient aware to have Driver (ride ) / caregiver  Harrell Lark  for 24 hours after surgery  Patient Special Instructions ----- Pre-Op special Instructions ----- Patient verbalized understanding of instructions that were given at this phone interview. Patient denies shortness of breath, chest pain, fever, cough at this phone interview.

## 2023-04-30 ENCOUNTER — Encounter (HOSPITAL_BASED_OUTPATIENT_CLINIC_OR_DEPARTMENT_OTHER)
Admission: RE | Disposition: A | Discharge status: Home / Self Care | Payer: Self-pay | Source: Home / Self Care | Attending: Urology

## 2023-04-30 ENCOUNTER — Other Ambulatory Visit: Payer: Self-pay

## 2023-04-30 ENCOUNTER — Encounter (HOSPITAL_BASED_OUTPATIENT_CLINIC_OR_DEPARTMENT_OTHER): Payer: Self-pay | Admitting: Urology

## 2023-04-30 ENCOUNTER — Ambulatory Visit (HOSPITAL_BASED_OUTPATIENT_CLINIC_OR_DEPARTMENT_OTHER): Payer: BC Managed Care – PPO | Admitting: Anesthesiology

## 2023-04-30 ENCOUNTER — Ambulatory Visit (HOSPITAL_BASED_OUTPATIENT_CLINIC_OR_DEPARTMENT_OTHER)
Admission: RE | Admit: 2023-04-30 | Discharge: 2023-04-30 | Disposition: A | Discharge status: Home / Self Care | Payer: BC Managed Care – PPO | Attending: Urology | Admitting: Urology

## 2023-04-30 DIAGNOSIS — E669 Obesity, unspecified: Secondary | ICD-10-CM | POA: Insufficient documentation

## 2023-04-30 DIAGNOSIS — Z6833 Body mass index (BMI) 33.0-33.9, adult: Secondary | ICD-10-CM | POA: Diagnosis not present

## 2023-04-30 DIAGNOSIS — F17210 Nicotine dependence, cigarettes, uncomplicated: Secondary | ICD-10-CM | POA: Diagnosis not present

## 2023-04-30 DIAGNOSIS — N471 Phimosis: Secondary | ICD-10-CM | POA: Diagnosis present

## 2023-04-30 DIAGNOSIS — N481 Balanitis: Secondary | ICD-10-CM | POA: Insufficient documentation

## 2023-04-30 HISTORY — PX: CIRCUMCISION: SHX1350

## 2023-04-30 SURGERY — CIRCUMCISION, ADULT
Anesthesia: General

## 2023-04-30 MED ORDER — LIDOCAINE HCL (PF) 2 % IJ SOLN
INTRAMUSCULAR | Status: AC
  Filled 2023-04-30: qty 5

## 2023-04-30 MED ORDER — CLINDAMYCIN PHOSPHATE 900 MG/50ML IV SOLN
INTRAVENOUS | Status: AC
  Filled 2023-04-30: qty 50

## 2023-04-30 MED ORDER — CLINDAMYCIN PHOSPHATE 900 MG/50ML IV SOLN
900.0000 mg | INTRAVENOUS | Status: AC
  Administered 2023-04-30: 900 mg via INTRAVENOUS

## 2023-04-30 MED ORDER — FENTANYL CITRATE (PF) 100 MCG/2ML IJ SOLN
INTRAMUSCULAR | Status: AC
  Filled 2023-04-30: qty 2

## 2023-04-30 MED ORDER — MIDAZOLAM HCL 5 MG/5ML IJ SOLN
INTRAMUSCULAR | Status: DC | PRN
  Administered 2023-04-30: 2 mg via INTRAVENOUS

## 2023-04-30 MED ORDER — 0.9 % SODIUM CHLORIDE (POUR BTL) OPTIME
TOPICAL | Status: DC | PRN
Start: 2023-04-30 — End: 2023-04-30
  Administered 2023-04-30: 500 mL

## 2023-04-30 MED ORDER — LACTATED RINGERS IV SOLN: INTRAVENOUS | Status: DC

## 2023-04-30 MED ORDER — OXYCODONE HCL 5 MG PO TABS
ORAL_TABLET | ORAL | Status: AC
  Filled 2023-04-30: qty 1

## 2023-04-30 MED ORDER — PROPOFOL 10 MG/ML IV BOLUS
INTRAVENOUS | Status: AC
  Filled 2023-04-30: qty 20

## 2023-04-30 MED ORDER — DEXMEDETOMIDINE HCL IN NACL 80 MCG/20ML IV SOLN
INTRAVENOUS | Status: AC
  Filled 2023-04-30: qty 20

## 2023-04-30 MED ORDER — ACETAMINOPHEN 500 MG PO TABS
ORAL_TABLET | ORAL | Status: AC
  Filled 2023-04-30: qty 2

## 2023-04-30 MED ORDER — GLYCOPYRROLATE PF 0.2 MG/ML IJ SOSY
PREFILLED_SYRINGE | INTRAMUSCULAR | Status: DC | PRN
  Administered 2023-04-30: .2 mg via INTRAVENOUS

## 2023-04-30 MED ORDER — DEXAMETHASONE SODIUM PHOSPHATE 10 MG/ML IJ SOLN
INTRAMUSCULAR | Status: DC | PRN
  Administered 2023-04-30: 10 mg via INTRAVENOUS

## 2023-04-30 MED ORDER — PHENYLEPHRINE 80 MCG/ML (10ML) SYRINGE FOR IV PUSH (FOR BLOOD PRESSURE SUPPORT)
PREFILLED_SYRINGE | INTRAVENOUS | Status: AC
  Filled 2023-04-30: qty 10

## 2023-04-30 MED ORDER — DEXMEDETOMIDINE HCL IN NACL 80 MCG/20ML IV SOLN
INTRAVENOUS | Status: DC | PRN
  Administered 2023-04-30: 12 ug via INTRAVENOUS

## 2023-04-30 MED ORDER — FENTANYL CITRATE (PF) 100 MCG/2ML IJ SOLN
INTRAMUSCULAR | Status: DC | PRN
  Administered 2023-04-30 (×2): 50 ug via INTRAVENOUS

## 2023-04-30 MED ORDER — ONDANSETRON HCL 4 MG/2ML IJ SOLN
INTRAMUSCULAR | Status: AC
  Filled 2023-04-30: qty 2

## 2023-04-30 MED ORDER — SENNOSIDES-DOCUSATE SODIUM 8.6-50 MG PO TABS: 1.0000 | ORAL_TABLET | Freq: Two times a day (BID) | ORAL | 0 refills | Status: AC

## 2023-04-30 MED ORDER — BUPIVACAINE HCL 0.25 % IJ SOLN
INTRAMUSCULAR | Status: DC | PRN
  Administered 2023-04-30: 10 mL

## 2023-04-30 MED ORDER — LIDOCAINE HCL (PF) 1 % IJ SOLN
INTRAMUSCULAR | Status: DC | PRN
  Administered 2023-04-30: 10 mL

## 2023-04-30 MED ORDER — PROPOFOL 10 MG/ML IV BOLUS
INTRAVENOUS | Status: DC | PRN
  Administered 2023-04-30: 50 mg via INTRAVENOUS
  Administered 2023-04-30: 200 mg via INTRAVENOUS

## 2023-04-30 MED ORDER — LIDOCAINE 2% (20 MG/ML) 5 ML SYRINGE
INTRAMUSCULAR | Status: DC | PRN
  Administered 2023-04-30: 100 mg via INTRAVENOUS

## 2023-04-30 MED ORDER — AMISULPRIDE (ANTIEMETIC) 5 MG/2ML IV SOLN: 10.0000 mg | Freq: Once | INTRAVENOUS | Status: DC | PRN

## 2023-04-30 MED ORDER — OXYCODONE HCL 5 MG/5ML PO SOLN: 5.0000 mg | Freq: Once | ORAL | Status: AC | PRN

## 2023-04-30 MED ORDER — MIDAZOLAM HCL 2 MG/2ML IJ SOLN
INTRAMUSCULAR | Status: AC
  Filled 2023-04-30: qty 2

## 2023-04-30 MED ORDER — FENTANYL CITRATE (PF) 100 MCG/2ML IJ SOLN: 25.0000 ug | INTRAMUSCULAR | Status: DC | PRN

## 2023-04-30 MED ORDER — PHENYLEPHRINE 80 MCG/ML (10ML) SYRINGE FOR IV PUSH (FOR BLOOD PRESSURE SUPPORT)
PREFILLED_SYRINGE | INTRAVENOUS | Status: DC | PRN
  Administered 2023-04-30 (×3): 80 ug via INTRAVENOUS
  Administered 2023-04-30: 160 ug via INTRAVENOUS
  Administered 2023-04-30: 80 ug via INTRAVENOUS

## 2023-04-30 MED ORDER — ONDANSETRON HCL 4 MG/2ML IJ SOLN
INTRAMUSCULAR | Status: DC | PRN
  Administered 2023-04-30: 4 mg via INTRAVENOUS

## 2023-04-30 MED ORDER — DEXAMETHASONE SODIUM PHOSPHATE 10 MG/ML IJ SOLN
INTRAMUSCULAR | Status: AC
  Filled 2023-04-30: qty 1

## 2023-04-30 MED ORDER — HYDROCODONE-ACETAMINOPHEN 5-325 MG PO TABS: 1.0000 | ORAL_TABLET | Freq: Four times a day (QID) | ORAL | 0 refills | Status: AC | PRN

## 2023-04-30 MED ORDER — ACETAMINOPHEN 500 MG PO TABS
1000.0000 mg | ORAL_TABLET | Freq: Once | ORAL | Status: AC
  Administered 2023-04-30: 1000 mg via ORAL

## 2023-04-30 MED ORDER — OXYCODONE HCL 5 MG PO TABS
5.0000 mg | ORAL_TABLET | Freq: Once | ORAL | Status: AC | PRN
  Administered 2023-04-30: 5 mg via ORAL

## 2023-04-30 SURGICAL SUPPLY — 30 items
BLADE SURG 15 STRL LF DISP TIS (BLADE) ×2 IMPLANT
BLADE SURG 15 STRL SS (BLADE) ×1
BNDG CMPR 75X21 PLY HI ABS (MISCELLANEOUS) ×1
BNDG COHESIVE 1X5 TAN STRL LF (GAUZE/BANDAGES/DRESSINGS) ×2 IMPLANT
COVER BACK TABLE 60X90IN (DRAPES) ×2 IMPLANT
COVER MAYO STAND STRL (DRAPES) ×2 IMPLANT
DRAPE LAPAROTOMY 100X72 PEDS (DRAPES) ×2 IMPLANT
ELECT NDL TIP 2.8 STRL (NEEDLE) IMPLANT
ELECT NEEDLE TIP 2.8 STRL (NEEDLE) IMPLANT
ELECT REM PT RETURN 9FT ADLT (ELECTROSURGICAL) ×1
ELECTRODE REM PT RTRN 9FT ADLT (ELECTROSURGICAL) ×2 IMPLANT
GAUZE 4X4 16PLY ~~LOC~~+RFID DBL (SPONGE) ×2 IMPLANT
GAUZE STRETCH 2X75IN STRL (MISCELLANEOUS) ×2 IMPLANT
GAUZE XEROFORM 1X8 LF (GAUZE/BANDAGES/DRESSINGS) ×2 IMPLANT
GLOVE BIO SURGEON STRL SZ7.5 (GLOVE) ×2 IMPLANT
GOWN STRL REUS W/TWL LRG LVL3 (GOWN DISPOSABLE) ×2 IMPLANT
KIT TURNOVER CYSTO (KITS) ×2 IMPLANT
NDL HYPO 25X1 1.5 SAFETY (NEEDLE) ×2 IMPLANT
NEEDLE HYPO 25X1 1.5 SAFETY (NEEDLE) ×1 IMPLANT
NS IRRIG 500ML POUR BTL (IV SOLUTION) IMPLANT
PACK BASIN DAY SURGERY FS (CUSTOM PROCEDURE TRAY) ×2 IMPLANT
PENCIL SMOKE EVACUATOR (MISCELLANEOUS) ×2 IMPLANT
SLEEVE SCD COMPRESS KNEE MED (STOCKING) ×2 IMPLANT
SUT VIC AB 3-0 SH 27 (SUTURE) ×3
SUT VIC AB 3-0 SH 27X BRD (SUTURE) IMPLANT
SUT VIC AB 3-0 SH 27XBRD (SUTURE) IMPLANT
SYR CONTROL 10ML LL (SYRINGE) ×2 IMPLANT
TOWEL OR 17X24 6PK STRL BLUE (TOWEL DISPOSABLE) ×2 IMPLANT
TRAY DSU PREP LF (CUSTOM PROCEDURE TRAY) ×2 IMPLANT
WATER STERILE IRR 500ML POUR (IV SOLUTION) IMPLANT

## 2023-04-30 NOTE — Anesthesia Preprocedure Evaluation (Addendum)
Anesthesia Evaluation  Patient identified by MRN, date of birth, ID band Patient awake    Reviewed: Allergy & Precautions, NPO status , Patient's Chart, lab work & pertinent test results  History of Anesthesia Complications Negative for: history of anesthetic complications  Airway Mallampati: II  TM Distance: >3 FB Neck ROM: Full   Comment: Previous grade I view with Miller 3, easy mask Dental  (+)    Pulmonary neg shortness of breath, neg sleep apnea, neg COPD, neg recent URI, Current Smoker (smokes 6 cigarettes/day)Patient did not abstain from smoking.   Pulmonary exam normal breath sounds clear to auscultation       Cardiovascular negative cardio ROS  Rhythm:Regular Rate:Normal     Neuro/Psych negative neurological ROS     GI/Hepatic negative GI ROS, Neg liver ROS,,,  Endo/Other  negative endocrine ROS    Renal/GU negative Renal ROS     Musculoskeletal  (+) Arthritis ,    Abdominal  (+) + obese  Peds  Hematology negative hematology ROS (+)   Anesthesia Other Findings   Reproductive/Obstetrics                             Anesthesia Physical Anesthesia Plan  ASA: 2  Anesthesia Plan: General   Post-op Pain Management: Tylenol PO (pre-op)*   Induction: Intravenous  PONV Risk Score and Plan: 1 and Ondansetron, Dexamethasone and Treatment may vary due to age or medical condition  Airway Management Planned: LMA  Additional Equipment:   Intra-op Plan:   Post-operative Plan: Extubation in OR  Informed Consent: I have reviewed the patients History and Physical, chart, labs and discussed the procedure including the risks, benefits and alternatives for the proposed anesthesia with the patient or authorized representative who has indicated his/her understanding and acceptance.     Dental advisory given  Plan Discussed with: Anesthesiologist and CRNA  Anesthesia Plan Comments:  (Risks of general anesthesia discussed including, but not limited to, sore throat, hoarse voice, chipped/damaged teeth, injury to vocal cords, nausea and vomiting, allergic reactions, lung infection, heart attack, stroke, and death. All questions answered. )        Anesthesia Quick Evaluation

## 2023-04-30 NOTE — Anesthesia Procedure Notes (Signed)
Procedure Name: LMA Insertion Date/Time: 04/30/2023 11:01 AM  Performed by: Bishop Limbo, CRNAPre-anesthesia Checklist: Patient identified, Emergency Drugs available, Suction available and Patient being monitored Patient Re-evaluated:Patient Re-evaluated prior to induction Oxygen Delivery Method: Circle System Utilized Preoxygenation: Pre-oxygenation with 100% oxygen Induction Type: IV induction Ventilation: Mask ventilation without difficulty LMA: LMA inserted LMA Size: 5.0 Number of attempts: 1 Placement Confirmation: positive ETCO2 Tube secured with: Tape Dental Injury: Teeth and Oropharynx as per pre-operative assessment

## 2023-04-30 NOTE — Transfer of Care (Signed)
Immediate Anesthesia Transfer of Care Note  Patient: Richard Turner  Procedure(s) Performed: CIRCUMCISION ADULT  Patient Location: PACU  Anesthesia Type:General  Level of Consciousness: drowsy, patient cooperative, and responds to stimulation  Airway & Oxygen Therapy: Patient Spontanous Breathing and Patient connected to face mask oxygen  Post-op Assessment: Report given to RN and Post -op Vital signs reviewed and stable  Post vital signs: Reviewed and stable  Last Vitals:  Vitals Value Taken Time  BP 112/56 04/30/23 1153  Temp    Pulse 79 04/30/23 1157  Resp 15 04/30/23 1157  SpO2 100 % 04/30/23 1157  Vitals shown include unfiled device data.  Last Pain:  Vitals:   04/30/23 1153  TempSrc:   PainSc: Asleep      Patients Stated Pain Goal: 3 (04/30/23 1610)  Complications: No notable events documented.

## 2023-04-30 NOTE — Progress Notes (Signed)
Dr Berneice Heinrich at bedside.  Stated patient did not have to void prior to Discharge.

## 2023-04-30 NOTE — Anesthesia Postprocedure Evaluation (Signed)
Anesthesia Post Note  Patient: Richard Turner  Procedure(s) Performed: CIRCUMCISION ADULT     Patient location during evaluation: PACU Anesthesia Type: General Level of consciousness: awake Pain management: pain level controlled Vital Signs Assessment: post-procedure vital signs reviewed and stable Respiratory status: spontaneous breathing, nonlabored ventilation and respiratory function stable Cardiovascular status: blood pressure returned to baseline and stable Postop Assessment: no apparent nausea or vomiting Anesthetic complications: no   No notable events documented.  Last Vitals:  Vitals:   04/30/23 1215 04/30/23 1230  BP: 110/70 (!) 97/56  Pulse: 83 84  Resp: 15 17  Temp:    SpO2: 100% 94%    Last Pain:  Vitals:   04/30/23 1242  TempSrc:   PainSc: 4                  Linton Rump

## 2023-04-30 NOTE — H&P (Signed)
Richard Turner is an 42 y.o. male.    Chief Complaint: Pre-Op Circumcision  HPI:   1 - Phimosis / Recurrent Balanitis - years of phimosis with tighness and tearing of foreskin and occasional balanitis. Has consdiered circ for years. Non-diabetic   PMH sig for mild obesity. No CV disease / blood thinners. Works at [employer] in [place]. His PCP is Felecia Shelling MD with [facility]   Today " Naji " (prnounced like Surge) is seen to proceed with circumcision. NO interval fevers.    Past Medical History:  Diagnosis Date   Arthritis     Past Surgical History:  Procedure Laterality Date   LUMBAR LAMINECTOMY/DECOMPRESSION MICRODISCECTOMY N/A 04/13/2019   Procedure: LUMBAR 4-5 DECOMPRESSION;  Surgeon: Estill Bamberg, MD;  Location: [facility] OR;  Service: Orthopedics;  Laterality: N/A;    History reviewed. No pertinent family history. Social History:  reports that he has been smoking cigarettes. He has never used smokeless tobacco. He reports that he does not drink alcohol and does not use drugs.  Allergies: No Known Allergies  No medications prior to admission.    No results found for this or any previous visit (from the past 48 hour(s)). No results found.  Review of Systems  Constitutional:  Negative for chills and fever.  All other systems reviewed and are negative.   Height 6\' 3"  (1.905 m), weight 121.6 kg. Physical Exam Vitals reviewed.  HENT:     Head: Normocephalic.  Eyes:     Pupils: Pupils are equal, round, and reactive to light.  Cardiovascular:     Rate and Rhythm: Normal rate.  Pulmonary:     Effort: Pulmonary effort is normal.  Abdominal:     General: Abdomen is flat.  Genitourinary:    Comments: Stable moderate phimosis w/o active balanitis.  Musculoskeletal:        General: Normal range of motion.     Cervical back: Normal range of motion.  Skin:    General: Skin is warm.  Neurological:     General: No focal deficit present.     Mental Status: He is  alert.  Psychiatric:        Mood and Affect: Mood normal.      Assessment/Plan  Proceed as planned with circumcision. Risks, benefits, alternatives, expected peri-op course discussed previously and reiterated today.   Loletta Parish., MD 04/30/2023, 7:04 AM

## 2023-04-30 NOTE — Brief Op Note (Signed)
04/30/2023  11:43 AM  PATIENT:  Richard Turner  42 y.o. male  PRE-OPERATIVE DIAGNOSIS:  PHIMOSIS  POST-OPERATIVE DIAGNOSIS:  PHIMOSIS  PROCEDURE:  Procedure(s) with comments: CIRCUMCISION ADULT (N/A) - 45 MINS  SURGEON:  Surgeons and Role:    * Zaccai Chavarin, Delbert Phenix., MD - Primary  PHYSICIAN ASSISTANT:   ASSISTANTS: none   ANESTHESIA:   general  EBL:  minimal   BLOOD ADMINISTERED:none  DRAINS: none   LOCAL MEDICATIONS USED:  MARCAINE     SPECIMEN:  Source of Specimen:  redundant phimotic foreskin  DISPOSITION OF SPECIMEN:   discard  COUNTS:  YES  TOURNIQUET:  * No tourniquets in log *  DICTATION: .Other Dictation: Dictation Number [id number]  PLAN OF CARE: Discharge to home after PACU  PATIENT DISPOSITION:  PACU - hemodynamically stable.   Delay start of Pharmacological VTE agent (>24hrs) due to surgical blood loss or risk of bleeding: not applicable

## 2023-04-30 NOTE — Discharge Instructions (Addendum)
1 - Dressing can be removed tomorrow morning, OK to shower after dressing is off. All stitches are dissolvable and will disappear over about 2-3 weeks. No active sexual stimulation x 2 weeks.   2 - Call MD or go to ER for fever >102, severe pain / nausea / vomiting not relieved by medications, or acute change in medical status   Post Anesthesia Home Care Instructions  Activity: Get plenty of rest for the remainder of the day. A responsible individual must stay with you for 24 hours following the procedure.  For the next 24 hours, DO NOT: -Drive a car -Advertising copywriter -Drink alcoholic beverages -Take any medication unless instructed by your physician -Make any legal decisions or sign important papers.  Meals: Start with liquid foods such as gelatin or soup. Progress to regular foods as tolerated. Avoid greasy, spicy, heavy foods. If nausea and/or vomiting occur, drink only clear liquids until the nausea and/or vomiting subsides. Call your physician if vomiting continues.  Special Instructions/Symptoms: Your throat may feel dry or sore from the anesthesia or the breathing tube placed in your throat during surgery. If this causes discomfort, gargle with warm salt water. The discomfort should disappear within 24 hours.

## 2023-05-03 ENCOUNTER — Encounter (HOSPITAL_BASED_OUTPATIENT_CLINIC_OR_DEPARTMENT_OTHER): Payer: Self-pay | Admitting: Urology

## 2023-05-03 NOTE — Op Note (Unsigned)
NAME: Richard Turner, Richard Turner MEDICAL RECORD NO: [id number] ACCOUNT NO: [id number] DATE OF BIRTH: [birth year] FACILITY: [facility] LOCATION: [facility] PHYSICIAN: Sebastian Ache, MD  Operative Report   DATE OF PROCEDURE: 04/30/2023  PREOPERATIVE DIAGNOSIS:  Phimosis.  PROCEDURE:  Circumcision with penile block.  ESTIMATED BLOOD LOSS:  Nil.  COMPLICATIONS:  None.  SPECIMEN:  Redundant phimotic foreskin for discard.  FINDINGS: 1.  Moderate phimosis 2.  Large phallic size.  INDICATIONS:  The patient is a pleasant 42 year old man with history of progressive phimosis and rare balanitis. He has been considering circumcision for definitive management of this for a number of years.  He represented recently, wishing to proceed  with circumcision as phimoses was becoming more problematic.  He has excellent glucose parameters at this point and was felt to be a good candidate.  Informed consent was obtained and placed in medical record.  PROCEDURE IN DETAIL:  The patient being verified, procedure being circumcision and penile block was confirmed.  Procedure timeout was performed.  Intravenous antibiotics administered.  General LMA anesthesia induced.  The patient was placed into a supine  position.  Sterile field was created, prepped and draped the patient's penis, perineum, and proximal thighs using iodine including his inner and outer foreskin leaflet and sterile drapes were applied.  Initial penile block was performed using 10 mL of a  50:50 Marcaine, lidocaine slurry in a ring block type fashion at the base of the penis.  Additional 10 mL placed just inferior to the pubic ramus along the presumed course of the dorsal penile nerve.  Next, the proximal outer foreskin leaflet was marked  at the level of the unstretched corona of the glans, 12 o'clock position was marked as well and a circumferential incision was made at this level.  Foreskin was reduced and another circumferential incision was made  approximately 1 cm proximal to the  corona of  glans.  The redundant preputial collar was connected in 12 o'clock position, marked with 4 hemostats and then dissected away from the underlying tissue using cautery dissection.  Given his anatomy, there was a significant amount of skin,  exquisite care was taken to avoid taking too much.  The frenulum was trimmed to cosmesis and reapproximated using U stitch x2 and stay sutures were applied at the 6 o'clock and 12 o'clock positions respectively.  Skin edges were reapproximated using two  separate running suture lines of 3-0 silk from the 6 o'clock to 12 o'clock position, which resulted in excellent tension-free apposition.  A dressing of Xeroform followed by loose Kling and Coban was applied.  Procedure was terminated.  The patient  tolerated the procedure well, no immediate perioperative complications.  The patient was taken to postanesthesia care unit in stable condition.  Plan for discharge home.   SHW D: 04/30/2023 11:47:38 am T: 04/30/2023 1:59:00 pm  JOB: [id number]/ [id number]
